# Patient Record
Sex: Male | Born: 1960 | Race: White | Hispanic: No | Marital: Married | Smoking: Current every day smoker
Health system: Southern US, Community
[De-identification: ages and names within clinical notes are randomized; demographics above are authoritative.]

---

## 2000-09-26 ENCOUNTER — Observation Stay (HOSPITAL_COMMUNITY): Admission: RE | Admit: 2000-09-26 | Discharge: 2000-09-27 | Payer: Self-pay | Admitting: Neurosurgery

## 2000-10-29 ENCOUNTER — Encounter: Admission: RE | Admit: 2000-10-29 | Discharge: 2000-10-29 | Payer: Self-pay | Admitting: Neurosurgery

## 2001-01-28 ENCOUNTER — Encounter: Admission: RE | Admit: 2001-01-28 | Discharge: 2001-01-28 | Payer: Self-pay | Admitting: Neurosurgery

## 2005-05-01 ENCOUNTER — Ambulatory Visit: Payer: Self-pay | Admitting: Internal Medicine

## 2007-08-27 ENCOUNTER — Encounter: Payer: Self-pay | Admitting: Internal Medicine

## 2007-08-27 DIAGNOSIS — M25519 Pain in unspecified shoulder: Secondary | ICD-10-CM

## 2007-08-27 DIAGNOSIS — Z87898 Personal history of other specified conditions: Secondary | ICD-10-CM

## 2007-08-27 DIAGNOSIS — S82009A Unspecified fracture of unspecified patella, initial encounter for closed fracture: Secondary | ICD-10-CM

## 2008-01-07 ENCOUNTER — Telehealth (INDEPENDENT_AMBULATORY_CARE_PROVIDER_SITE_OTHER): Payer: Self-pay | Admitting: *Deleted

## 2013-12-09 ENCOUNTER — Other Ambulatory Visit: Payer: Self-pay | Admitting: Family Medicine

## 2013-12-09 DIAGNOSIS — R740 Nonspecific elevation of levels of transaminase and lactic acid dehydrogenase [LDH]: Principal | ICD-10-CM

## 2013-12-09 DIAGNOSIS — R7401 Elevation of levels of liver transaminase levels: Secondary | ICD-10-CM

## 2013-12-22 ENCOUNTER — Ambulatory Visit
Admission: RE | Admit: 2013-12-22 | Discharge: 2013-12-22 | Disposition: A | Discharge status: Home / Self Care | Payer: BC Managed Care – PPO | Source: Ambulatory Visit | Attending: Family Medicine | Admitting: Family Medicine

## 2013-12-22 DIAGNOSIS — R740 Nonspecific elevation of levels of transaminase and lactic acid dehydrogenase [LDH]: Principal | ICD-10-CM

## 2013-12-22 DIAGNOSIS — R7401 Elevation of levels of liver transaminase levels: Secondary | ICD-10-CM

## 2015-08-14 IMAGING — US US ABDOMEN LIMITED
1 series · 14 of 25 positions shown · non-contrast
Comparison: None.

CLINICAL DATA: Elevated liver function tests

EXAM:
US ABDOMEN LIMITED - RIGHT UPPER QUADRANT

[Series 1: us abdomen limited · 0.28mm/px · 14 of 53 slices shown]
[im 1/53]
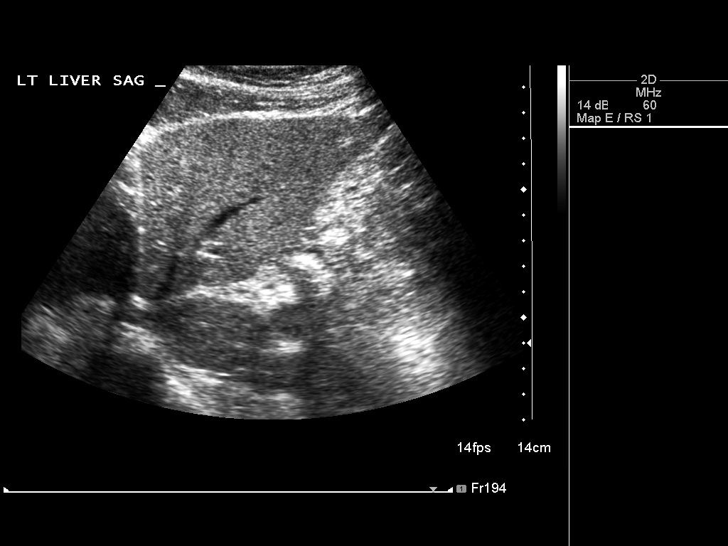
[im 5/53]
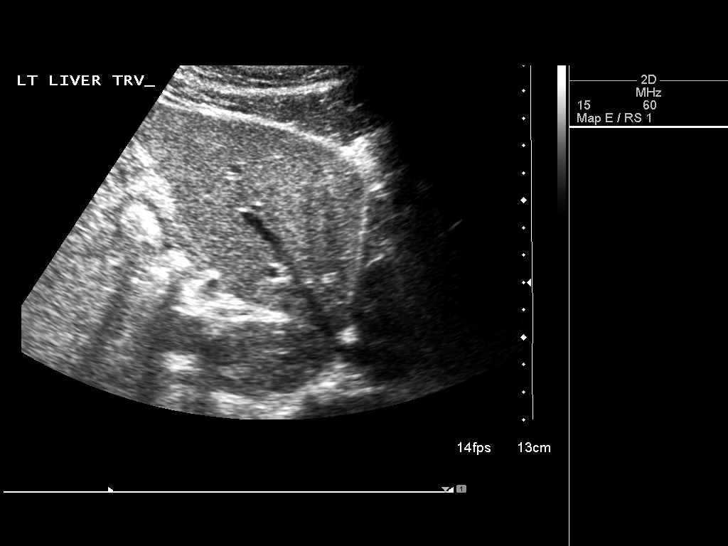
[im 9/53]
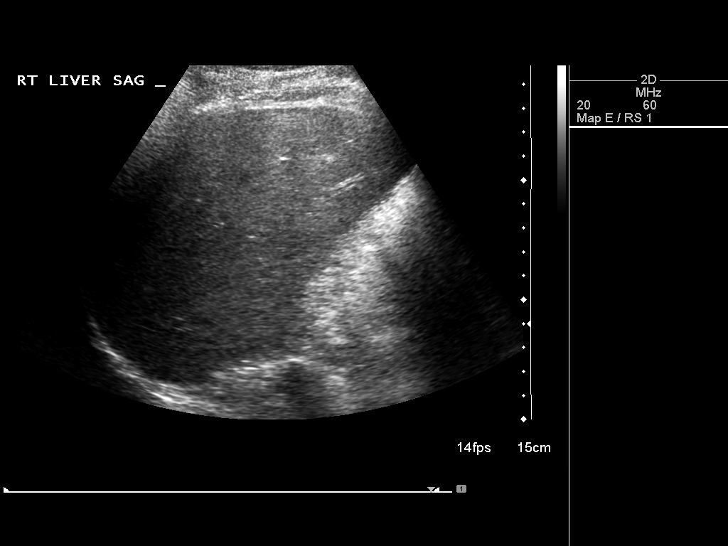
[im 14/53]
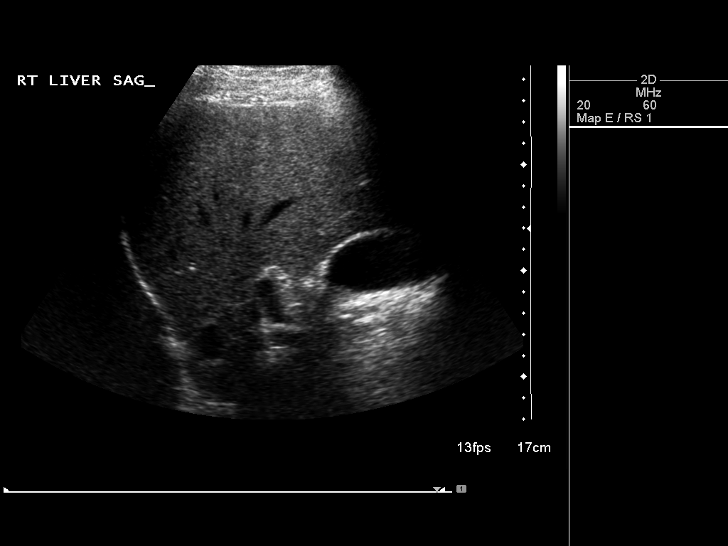
[im 18/53]
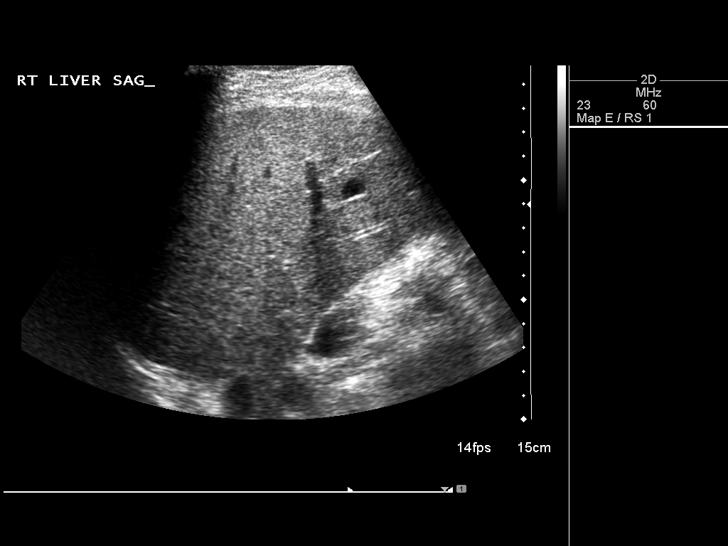
[im 20/53]
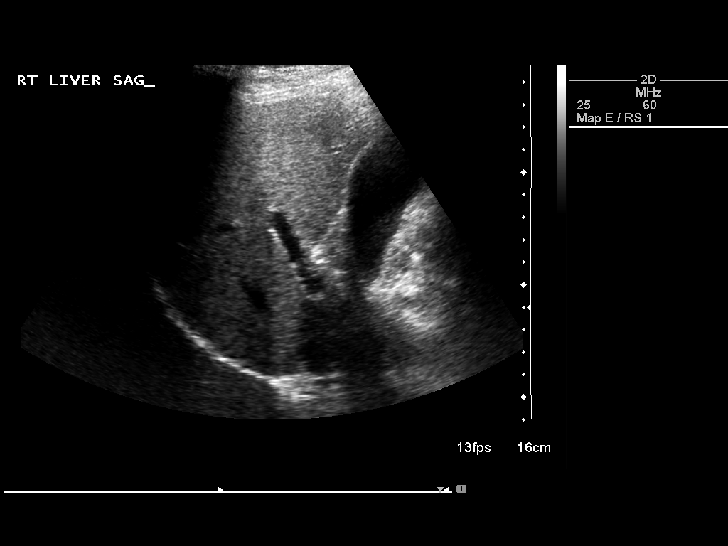
[im 24/53]
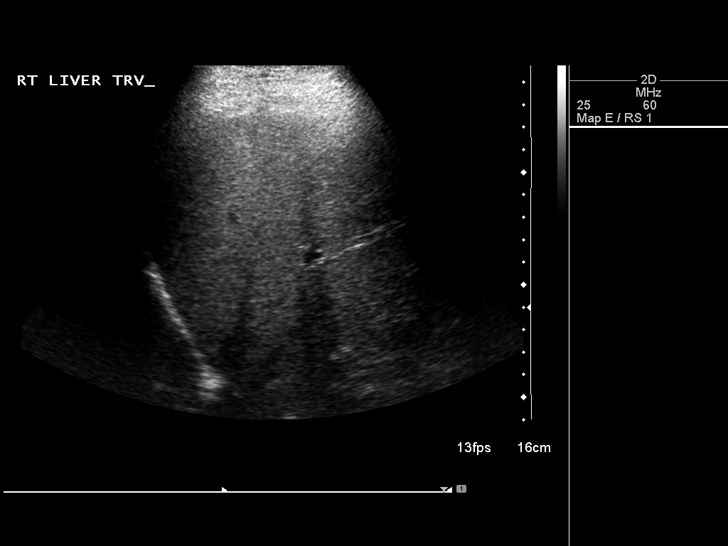
[im 29/53]
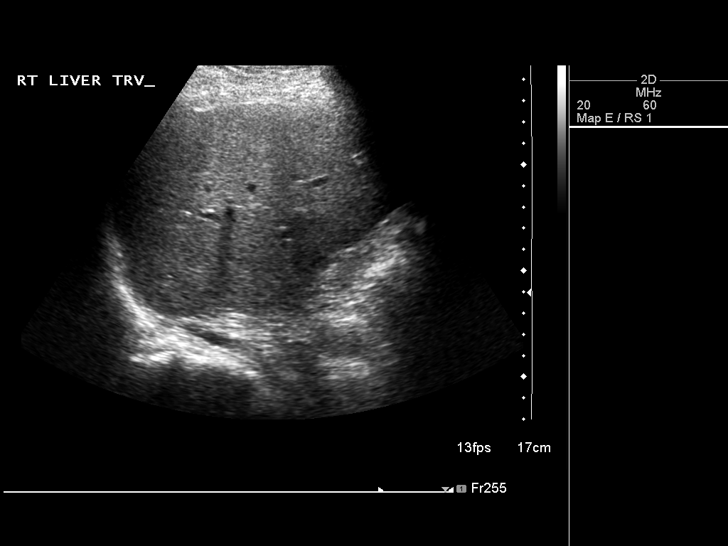
[im 33/53]
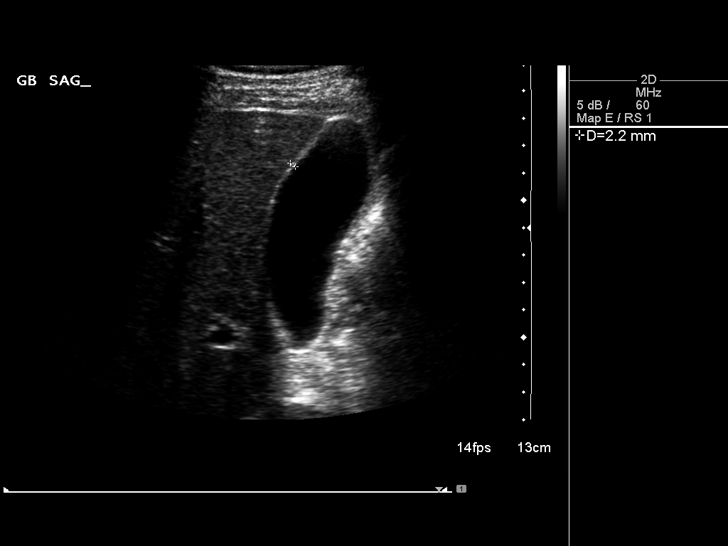
[im 35/53]
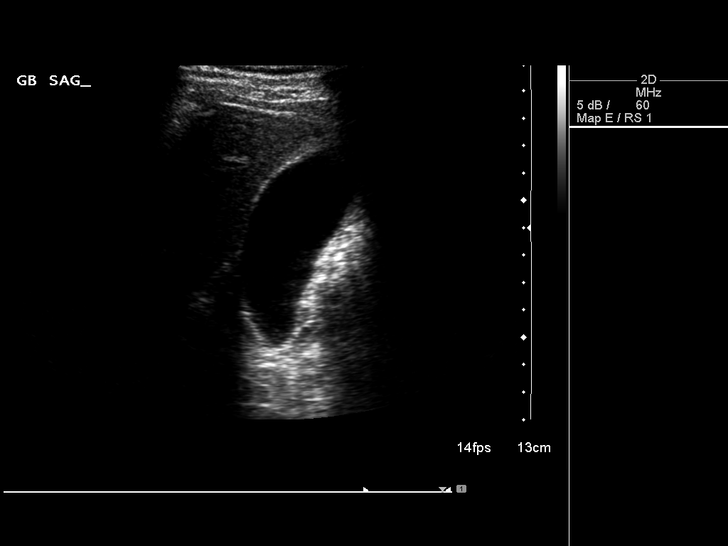
[im 40/53]
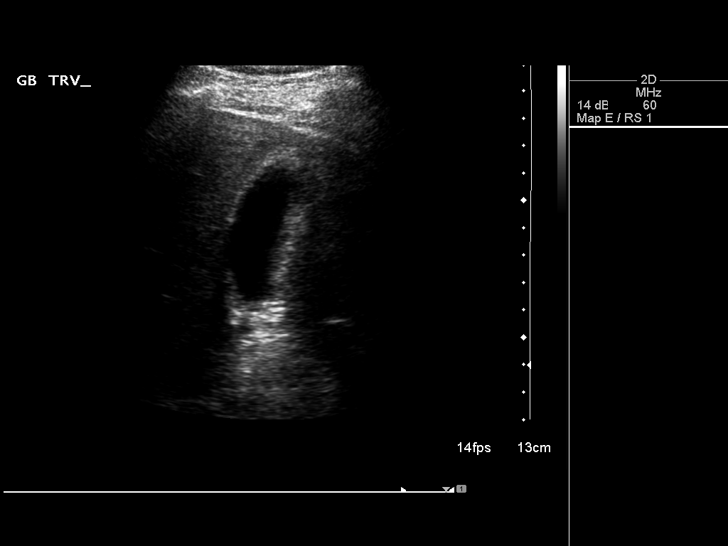
[im 44/53]
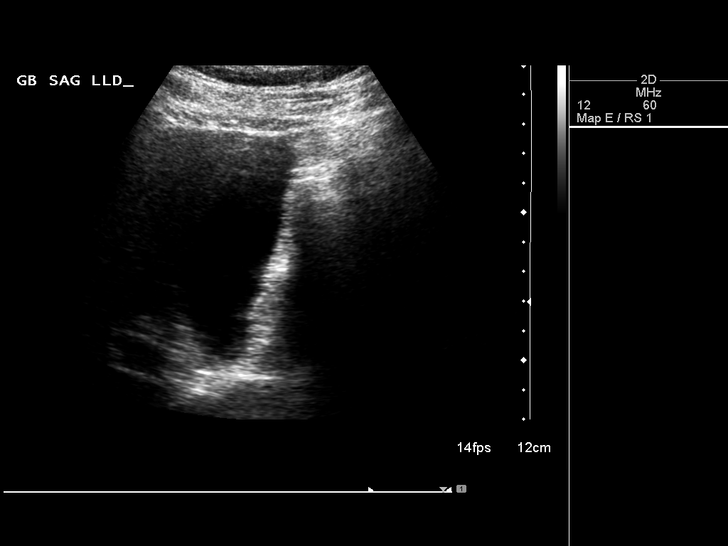
[im 48/53]
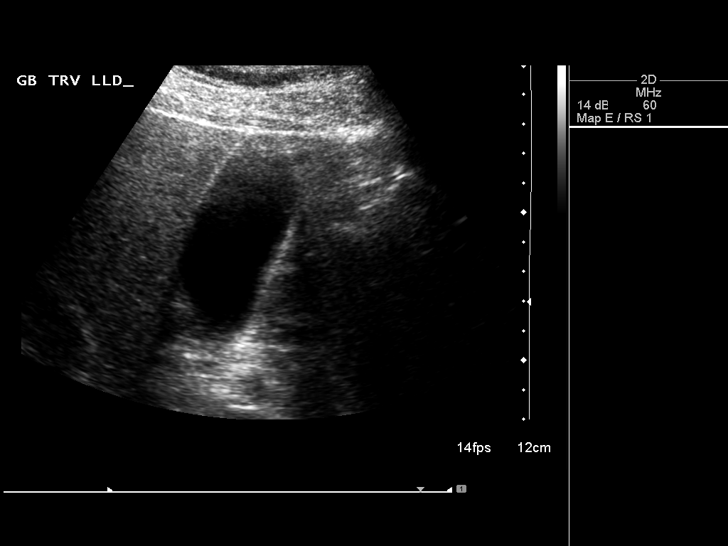
[im 53/53]
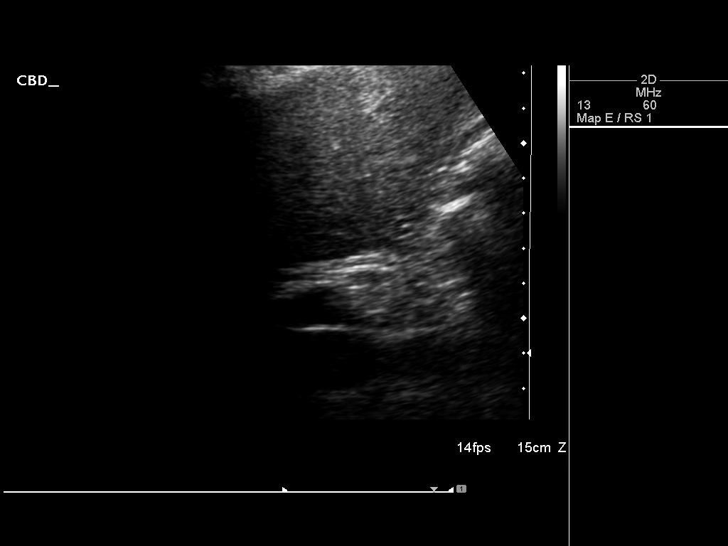

[14 of 25 positions shown; findings below may reference images not displayed]

FINDINGS: Gallbladder:

No gallstones or wall thickening visualized. No sonographic Murphy
sign noted.

Common bile duct:

Diameter: 4.1 mm

Liver:

Normal in size and parenchymal echogenicity. There is an 11 mm right
lobe cyst. No other liver masses or focal lesions. Normal
hepatopetal flow documented in the portal vein.
IMPRESSION: 1. Small, 11 mm, hepatic cyst in the right lobe. No other
abnormalities.

## 2016-06-04 DIAGNOSIS — W57XXXA Bitten or stung by nonvenomous insect and other nonvenomous arthropods, initial encounter: Secondary | ICD-10-CM | POA: Diagnosis not present

## 2016-06-04 DIAGNOSIS — L089 Local infection of the skin and subcutaneous tissue, unspecified: Secondary | ICD-10-CM | POA: Diagnosis not present

## 2016-06-12 DIAGNOSIS — M791 Myalgia: Secondary | ICD-10-CM | POA: Diagnosis not present

## 2016-06-12 DIAGNOSIS — R740 Nonspecific elevation of levels of transaminase and lactic acid dehydrogenase [LDH]: Secondary | ICD-10-CM | POA: Diagnosis not present

## 2016-06-12 DIAGNOSIS — Z0001 Encounter for general adult medical examination with abnormal findings: Secondary | ICD-10-CM | POA: Diagnosis not present

## 2016-06-12 DIAGNOSIS — E785 Hyperlipidemia, unspecified: Secondary | ICD-10-CM | POA: Diagnosis not present

## 2016-06-12 DIAGNOSIS — K4090 Unilateral inguinal hernia, without obstruction or gangrene, not specified as recurrent: Secondary | ICD-10-CM | POA: Diagnosis not present

## 2016-06-25 DIAGNOSIS — K4090 Unilateral inguinal hernia, without obstruction or gangrene, not specified as recurrent: Secondary | ICD-10-CM | POA: Diagnosis not present

## 2016-09-25 DIAGNOSIS — K4091 Unilateral inguinal hernia, without obstruction or gangrene, recurrent: Secondary | ICD-10-CM | POA: Diagnosis not present

## 2016-11-27 ENCOUNTER — Ambulatory Visit: Payer: 59 | Attending: Family Medicine | Admitting: Physical Therapy

## 2016-11-27 DIAGNOSIS — M25512 Pain in left shoulder: Secondary | ICD-10-CM | POA: Diagnosis present

## 2016-11-27 DIAGNOSIS — M542 Cervicalgia: Secondary | ICD-10-CM

## 2016-11-27 DIAGNOSIS — R293 Abnormal posture: Secondary | ICD-10-CM | POA: Diagnosis not present

## 2016-11-27 DIAGNOSIS — R208 Other disturbances of skin sensation: Secondary | ICD-10-CM | POA: Diagnosis present

## 2016-11-27 DIAGNOSIS — M6281 Muscle weakness (generalized): Secondary | ICD-10-CM | POA: Insufficient documentation

## 2016-11-27 DIAGNOSIS — G8929 Other chronic pain: Secondary | ICD-10-CM | POA: Diagnosis present

## 2016-11-27 NOTE — Therapy (Signed)
[facility] Outpatient Rehabilitation [facility] [address] [place], [place], [address] Phone: [phone]   Fax:  [phone]  Physical Therapy Evaluation  Patient Details  Name: Anthony Shields MRN: [id number] Date of Birth: 21-Sep-1960 Referring Provider: Dr. Susa Simmonds   Encounter Date: 11/27/2016      PT End of Session - 11/27/16 2136    Visit Number 1   Number of Visits 16   Date for PT Re-Evaluation 01/22/17   PT Start Time 0846   PT Stop Time 0930   PT Time Calculation (min) 44 min   Activity Tolerance Patient tolerated treatment well   Behavior During Therapy [facility] for tasks assessed/performed      No past medical history on file.  No past surgical history on file.  There were no vitals filed for this visit.       Subjective Assessment - 11/27/16 0854    Subjective Pt has had chronic intermittent pain in L shoulder for 2-3 yrs. He has chronic weakness in L biceps.  He has difficulty lifting, turning head and driving long periods.  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  Pain is in upper back, neck and topn of L shoulder.  He has tried to change pillows, thinks about his posture and feels it may be related to a chronic neck issue.    Pertinent History cervical fusion 15 yrs ago, inguinal hernia repair    Limitations Sitting;Lifting   How long can you sit comfortably? gets uncomfortable 30 min, changes hand position    Diagnostic tests none recent    Patient Stated Goals Pt would like to be able to lift, use arms and have less pain.    Currently in Pain? Yes   Pain Score 4    Pain Location Shoulder  neck    Pain Orientation Left   Pain Descriptors / Indicators Aching;Tightness;Burning   Pain Type Chronic pain   Pain Radiating Towards neck and superiod aspect shoulder, rarely in scapular area   Pain Onset More than a month ago   Pain Frequency Constant   Aggravating Factors  AM (sleep), driving, hold arms up    Pain Relieving Factors Ibuprofen, rest,  learned to deal with it.    Effect of Pain on Daily Activities limits comfort at work             [facility] PT Assessment - 11/27/16 0001      Assessment   Medical Diagnosis Lt rhomboid muscle pain, myalgia    Referring Provider Dr. Susa Simmonds    Onset Date/Surgical Date --  chronic    Hand Dominance Right   Next MD Visit none yet    Prior Therapy No      Precautions   Precautions None     Restrictions   Weight Bearing Restrictions No     Balance Screen   Has the patient fallen in the past 6 months No     Home Environment   Living Environment Private residence   Living Arrangements Spouse/significant other   Type of Home House     Prior Function   Level of Independence Independent   Vocation Full time employment   Leisure tinkering in garage and playing with grandkids      Cognition   Overall Cognitive Status Within Functional Limits for tasks assessed     Observation/Other Assessments   Observations L biceps atrophy, observed sacral sitting in lobby   Focus on Therapeutic Outcomes (FOTO)  nt  Sensation   Light Touch Impaired by gross assessment   Additional Comments slight tingling in L UE , legs go numb sometimes sitting too long      Posture/Postural Control   Posture/Postural Control Postural limitations   Postural Limitations Rounded Shoulders;Forward head;Posterior pelvic tilt   Posture Comments sway back, shoulders IR bilat.       AROM   Cervical Flexion 50   Cervical Extension 40  pain on L    Cervical - Right Side Bend 35   Cervical - Left Side Bend 35   Cervical - Right Rotation ERP , 15% limited    Cervical - Left Rotation ERP, 20% limited      Strength   Right Shoulder Flexion 5/5   Right Shoulder ABduction 5/5   Left Shoulder Flexion 4+/5   Left Shoulder ABduction 4+/5   Right Elbow Flexion 5/5   Right Elbow Extension 5/5   Left Elbow Flexion 4+/5   Left Elbow Extension 4+/5     Palpation   Palpation comment pain throughout upper and  middle cervical paraspinals, especially L side, upper traps knotty and referred pain into L prox UE.      Spurling's   Findings Negative   Side Left            Objective measurements completed on examination: See above findings.          [facility] Adult PT Treatment/Exercise - 11/27/16 0001      Neck Exercises: Standing   Neck Retraction 10 reps     Moist Heat Therapy   Number Minutes Moist Heat 10 Minutes   Moist Heat Location Cervical     Neck Exercises: Stretches   Upper Trapezius Stretch 2 reps;30 seconds   Levator Stretch 2 reps;30 seconds   Corner Stretch 3 reps                PT Education - 11/27/16 2136    Education provided Yes   Education Details posture, POC, PT, HEP, differential diagnosis    Person(s) Educated Patient   Methods Explanation;Demonstration;Tactile cues;Verbal cues;Handout   Comprehension Verbalized understanding;Need further instruction          PT Short Term Goals - 11/27/16 2149      PT SHORT TERM GOAL #1   Title Pt will be I with initial HEP   Time 4   Period Weeks   Status New   Target Date 12/25/16     PT SHORT TERM GOAL #2   Title Pt will be able to report taking stretch breaks frequently anf integrating postural awareness into travel for work.    Time 4   Period Weeks   Status New   Target Date 12/25/16     PT SHORT TERM GOAL #3   Title Pt will report 25% less sensory symptoms in L UE with driving, sleeping.     Time 4   Period Weeks   Status New   Target Date 12/25/16           PT Long Term Goals - 11/27/16 2153      PT LONG TERM GOAL #1   Title Pt will be able to turn head to drive without increasing pain for safety and comfort while driving.     Time 8   Period Weeks   Status New   Target Date 01/22/17     PT LONG TERM GOAL #2   Title Pt will be able to reach, lift and use arms  for work without increasing neck pain.    Time 8   Period Weeks   Status New   Target Date 01/22/17     PT  LONG TERM GOAL #3   Title Pt will be I with HEP for flexibility, posture and strength.    Time 8   Period Weeks   Status New   Target Date 01/22/17     PT LONG TERM GOAL #4   Title Pt will be I with concepts of lifting, sitting posture and make changes to seating to improve alignment   Time 8   Period Weeks   Status New   Target Date 01/22/17                Plan - 11/27/16 2137    Clinical Impression Statement Pt with low complexity of stable neck and upper back pain and sensory disturbance for 2-3 yrs intermittently. Exercises he was previously given have helped him to a degree.  Rhomboid muscle was minimally tender today.  Rotation and sidebending to L recreate pain in L prox shoulder.  Given his L elbow flex and ext weakness I suspect his cervical spine is involved.  His job sets him up for postural changes and weakness, he has modified his environment but could benefit from PT to further improve his ROM and strength, comfort with mobility.     Clinical Presentation Stable   Clinical Decision Making Low   Rehab Potential Good   PT Frequency Other (comment)  may only be able to do 1 x per week due to work schedule    PT Duration 8 weeks   PT Treatment/Interventions ADLs/Self Care Home Management;Neuromuscular re-education;Dry needling;Taping;Passive range of motion;Electrical Stimulation;Cryotherapy;Ultrasound;Moist Heat;Therapeutic activities;Therapeutic exercise;Patient/family education;Manual techniques   PT Next Visit Plan check HEP, manual, MHP and posture ed    PT Home Exercise Plan neck ROM, chin tuck, scap retract    Consulted and Agree with Plan of Care Patient      Patient will benefit from skilled therapeutic intervention in order to improve the following deficits and impairments:  Impaired flexibility, Impaired UE functional use, Hypomobility, Decreased strength, Decreased range of motion, Postural dysfunction, Increased fascial restricitons, Pain, Decreased  mobility  Visit Diagnosis: Other disturbances of skin sensation  Muscle weakness (generalized)  Abnormal posture  Cervicalgia  Chronic left shoulder pain     Problem List Patient Active Problem List   Diagnosis Date Noted  . SHOULDER PAIN, LEFT 08/27/2007  . FRACTURE, PATELLA 08/27/2007  . INGUINAL HERNIA, HX OF 08/27/2007    Sampson Self 11/27/2016, 10:08 PM  [facility] [address] [place], [place], [address] Phone: [phone]   Fax:  [phone]  Name: Eston EstersBarry K Vokes MRN: [id number] Date of Birth: Sep 11, 1960   Karie MainlandJennifer Britiany Silbernagel, PT 11/27/16 10:08 PM Phone: [phone] Fax: [phone]

## 2016-12-03 ENCOUNTER — Ambulatory Visit: Payer: 59 | Admitting: Physical Therapy

## 2016-12-03 DIAGNOSIS — M542 Cervicalgia: Secondary | ICD-10-CM

## 2016-12-03 DIAGNOSIS — R293 Abnormal posture: Secondary | ICD-10-CM

## 2016-12-03 DIAGNOSIS — R208 Other disturbances of skin sensation: Secondary | ICD-10-CM | POA: Diagnosis not present

## 2016-12-03 DIAGNOSIS — G8929 Other chronic pain: Secondary | ICD-10-CM

## 2016-12-03 DIAGNOSIS — M25512 Pain in left shoulder: Secondary | ICD-10-CM

## 2016-12-03 DIAGNOSIS — M6281 Muscle weakness (generalized): Secondary | ICD-10-CM

## 2016-12-03 NOTE — Therapy (Signed)
[facility] Outpatient Rehabilitation [facility] [address] [place], [place], [address] Phone: [phone]   Fax:  [phone]  Physical Therapy Treatment  Patient Details  Name: Anthony Shields MRN: [id number] Date of Birth: 03-18-1961 Referring Provider: Dr. Susa Simmonds   Encounter Date: 12/03/2016      PT End of Session - 12/03/16 0914    Visit Number 2   Number of Visits 16   Date for PT Re-Evaluation 01/22/17   PT Start Time 0848   PT Stop Time 0936   PT Time Calculation (min) 48 min   Activity Tolerance Patient tolerated treatment well   Behavior During Therapy [facility] for tasks assessed/performed      No past medical history on file.  No past surgical history on file.  There were no vitals filed for this visit.      Subjective Assessment - 12/03/16 0850    Subjective I've been hurting all week.  My exercises make it worse.  Lavenia Atlas been doing my exercises but lying down make my L UE arm numb.     Currently in Pain? Yes   Pain Score 4    Pain Location Shoulder  neck    Pain Orientation Left   Pain Descriptors / Indicators Aching;Tightness   Pain Type Chronic pain   Pain Onset More than a month ago   Pain Frequency Intermittent   Aggravating Factors  stretches make it worse maybe i;m pulling too hard   Pain Relieving Factors ibuprofen, rest              [facility] Adult PT Treatment/Exercise - 12/03/16 0001      Neck Exercises: Supine   Neck Retraction 10 reps;5 secs   Capital Flexion 10 reps   Capital Flexion Limitations and ext on yellow ball    Cervical Rotation Both;10 reps     Moist Heat Therapy   Number Minutes Moist Heat 15 Minutes   Moist Heat Location Cervical     Electrical Stimulation   Electrical Stimulation Location posterior cervicals   Electrical Stimulation Action IFC   Electrical Stimulation Parameters to tol    Electrical Stimulation Goals Pain     Manual Therapy   Manual Therapy Joint mobilization;Soft tissue  mobilization;Myofascial release;Passive ROM;Manual Traction   Soft tissue mobilization suboccipitals    Myofascial Release cervicals   Passive ROM rotation and sidebending to R      Neck Exercises: Stretches   Upper Trapezius Stretch 2 reps;30 seconds   Levator Stretch 2 reps;30 seconds   Corner Stretch 3 reps                PT Education - 12/03/16 0913    Education provided Yes   Education Details dont pull on head    Person(s) Educated Patient   Methods Explanation;Demonstration   Comprehension Verbalized understanding;Returned demonstration          PT Short Term Goals - 12/03/16 0930      PT SHORT TERM GOAL #1   Title Pt will be I with initial HEP   Status On-going     PT SHORT TERM GOAL #2   Title Pt will be able to report taking stretch breaks frequently anf integrating postural awareness into travel for work.    Status On-going     PT SHORT TERM GOAL #3   Title Pt will report 25% less sensory symptoms in L UE with driving, sleeping.     Status On-going  PT Long Term Goals - 11/27/16 2153      PT LONG TERM GOAL #1   Title Pt will be able to turn head to drive without increasing pain for safety and comfort while driving.     Time 8   Period Weeks   Status New   Target Date 01/22/17     PT LONG TERM GOAL #2   Title Pt will be able to reach, lift and use arms for work without increasing neck pain.    Time 8   Period Weeks   Status New   Target Date 01/22/17     PT LONG TERM GOAL #3   Title Pt will be I with HEP for flexibility, posture and strength.    Time 8   Period Weeks   Status New   Target Date 01/22/17     PT LONG TERM GOAL #4   Title Pt will be I with concepts of lifting, sitting posture and make changes to seating to improve alignment   Time 8   Period Weeks   Status New   Target Date 01/22/17               Plan - 12/03/16 3536    Clinical Impression Statement Patient with increased pain this week, asked  him to modify HEP by avoiding excessive pull on head.  Manual work effective in softening posterior cervicals.  Able to do corner stretch without increased numbness in L UE.  Trial of IFC and MHP to reduce soreness and pain.    PT Next Visit Plan check HEP, manual, MHP and posture ed    PT Home Exercise Plan neck ROM, chin tuck, scap retract    Consulted and Agree with Plan of Care Patient      Patient will benefit from skilled therapeutic intervention in order to improve the following deficits and impairments:  Impaired flexibility, Impaired UE functional use, Hypomobility, Decreased strength, Decreased range of motion, Postural dysfunction, Increased fascial restricitons, Pain, Decreased mobility  Visit Diagnosis: Other disturbances of skin sensation  Muscle weakness (generalized)  Abnormal posture  Chronic left shoulder pain  Cervicalgia     Problem List Patient Active Problem List   Diagnosis Date Noted  . SHOULDER PAIN, LEFT 08/27/2007  . FRACTURE, PATELLA 08/27/2007  . INGUINAL HERNIA, HX OF 08/27/2007    Beaumont Austad 12/03/2016, 9:32 AM  [facility] [address] [place], [place], [address] Phone: [phone]   Fax:  [phone]  Name: DONTREY SIKORSKI MRN: [id number] Date of Birth: 06/24/1960  Karie Mainland, PT 12/03/16 9:32 AM Phone: [phone] Fax: [phone]

## 2016-12-07 ENCOUNTER — Encounter: Payer: Self-pay | Admitting: Physical Therapy

## 2016-12-07 ENCOUNTER — Ambulatory Visit: Payer: 59 | Admitting: Physical Therapy

## 2016-12-07 DIAGNOSIS — G8929 Other chronic pain: Secondary | ICD-10-CM

## 2016-12-07 DIAGNOSIS — M542 Cervicalgia: Secondary | ICD-10-CM

## 2016-12-07 DIAGNOSIS — R208 Other disturbances of skin sensation: Principal | ICD-10-CM

## 2016-12-07 DIAGNOSIS — M6281 Muscle weakness (generalized): Secondary | ICD-10-CM

## 2016-12-07 DIAGNOSIS — M25512 Pain in left shoulder: Secondary | ICD-10-CM

## 2016-12-07 DIAGNOSIS — R293 Abnormal posture: Secondary | ICD-10-CM

## 2016-12-07 NOTE — Therapy (Addendum)
[facility] Outpatient Rehabilitation [facility] [address] [place], [place], [address] Phone: [phone]   Fax:  [phone]  Physical Therapy Treatment  Patient Details  Name: ANSEN OCASIO MRN: [id number] Date of Birth: [birth year] Referring Provider: Dr. Susa Simmonds   Encounter Date: 12/07/2016      PT End of Session - 12/07/16 1019    Visit Number 3   Number of Visits 16   Date for PT Re-Evaluation 01/22/17   PT Start Time 1018   PT Stop Time 1112   PT Time Calculation (min) 54 min   Activity Tolerance Patient tolerated treatment well   Behavior During Therapy [facility] for tasks assessed/performed      History reviewed. No pertinent past medical history.  History reviewed. No pertinent surgical history.  There were no vitals filed for this visit.      Subjective Assessment - 12/07/16 1020    Subjective Feeling better with static sitting but hurts with movements- sidebend bilat hurt on L.    Patient Stated Goals Pt would like to be able to lift, use arms and have less pain.    Currently in Pain? Yes   Pain Score 3   only when sidbending   Pain Location Shoulder   Pain Orientation Left;Upper   Pain Descriptors / Indicators Tightness                         [facility] Adult PT Treatment/Exercise - 12/07/16 0001      Exercises   Exercises Shoulder     Shoulder Exercises: Standing   External Rotation Limitations bilat UE blue tband   Extension 20 reps   Theraband Level (Shoulder Extension) Level 3 (Green)   Row 20 reps   Theraband Level (Shoulder Row) Level 4 (Blue)   Other Standing Exercises horiz abd + ext behind back     Modalities   Modalities Moist Heat     Moist Heat Therapy   Number Minutes Moist Heat 15 Minutes   Moist Heat Location Cervical     Manual Therapy   Manual Therapy Joint mobilization   Manual therapy comments skilled monitoring & palpation while DN   Joint Mobilization first rib depression, gross rib ER   Soft tissue mobilization L upper trap          Trigger Point Dry Needling - 12/07/16 1058    Consent Given? Yes   Education Handout Provided --  verbal education   Muscles Treated Upper Body Upper trapezius   Upper Trapezius Response Twitch reponse elicited;Palpable increased muscle length              PT Education - 12/07/16 1107    Education provided Yes   Education Details exercise form/rationale, TPDN & expected outcomes, importance of posture in daily activities   Person(s) Educated Patient   Methods Explanation;Demonstration;Tactile cues;Verbal cues;Handout   Comprehension Verbalized understanding;Returned demonstration;Verbal cues required;Tactile cues required;Need further instruction          PT Short Term Goals - 12/03/16 0930      PT SHORT TERM GOAL #1   Title Pt will be I with initial HEP   Status On-going     PT SHORT TERM GOAL #2   Title Pt will be able to report taking stretch breaks frequently anf integrating postural awareness into travel for work.    Status On-going     PT SHORT TERM GOAL #3   Title Pt will report 25% less sensory symptoms  in L UE with driving, sleeping.     Status On-going           PT Long Term Goals - 11/27/16 2153      PT LONG TERM GOAL #1   Title Pt will be able to turn head to drive without increasing pain for safety and comfort while driving.     Time 8   Period Weeks   Status New   Target Date 01/22/17     PT LONG TERM GOAL #2   Title Pt will be able to reach, lift and use arms for work without increasing neck pain.    Time 8   Period Weeks   Status New   Target Date 01/22/17     PT LONG TERM GOAL #3   Title Pt will be I with HEP for flexibility, posture and strength.    Time 8   Period Weeks   Status New   Target Date 01/22/17     PT LONG TERM GOAL #4   Title Pt will be I with concepts of lifting, sitting posture and make changes to seating to improve alignment   Time 8   Period Weeks   Status  New   Target Date 01/22/17               Plan - 12/07/16 1104    Clinical Impression Statement Reported "still hurts but feels better" after TPDN to L upper trap. Was educated on soreness that will follow. Pt was able to complete exercises and perform appropriate scapular control without numbness in arm.    PT Treatment/Interventions ADLs/Self Care Home Management;Neuromuscular re-education;Dry needling;Taping;Passive range of motion;Electrical Stimulation;Cryotherapy;Ultrasound;Moist Heat;Therapeutic activities;Therapeutic exercise;Patient/family education;Manual techniques   PT Next Visit Plan check HEP, manual, MHP and posture ed    PT Home Exercise Plan neck ROM, chin tuck, scap retract, bilat UE ER, resisted ext, row   Consulted and Agree with Plan of Care Patient      Patient will benefit from skilled therapeutic intervention in order to improve the following deficits and impairments:  Impaired flexibility, Impaired UE functional use, Hypomobility, Decreased strength, Decreased range of motion, Postural dysfunction, Increased fascial restricitons, Pain, Decreased mobility  Visit Diagnosis: Other disturbances of skin sensation  Muscle weakness (generalized)  Abnormal posture  Chronic left shoulder pain  Cervicalgia     Problem List Patient Active Problem List   Diagnosis Date Noted  . SHOULDER PAIN, LEFT 08/27/2007  . FRACTURE, PATELLA 08/27/2007  . INGUINAL HERNIA, HX OF 08/27/2007    Benoit Meech C. Jiovany Scheffel PT, DPT 12/07/16 11:11 AM   [facility] Outpatient Rehabilitation [facility] [address] [place], [place], [address] Phone: [phone]   Fax:  [phone]  Name: ERVINE WITUCKI MRN: [id number] Date of Birth: 12/04/1960

## 2016-12-10 ENCOUNTER — Encounter: Payer: Self-pay | Admitting: Physical Therapy

## 2016-12-10 ENCOUNTER — Ambulatory Visit: Payer: 59 | Admitting: Physical Therapy

## 2016-12-10 DIAGNOSIS — R208 Other disturbances of skin sensation: Secondary | ICD-10-CM | POA: Diagnosis not present

## 2016-12-10 DIAGNOSIS — M6281 Muscle weakness (generalized): Secondary | ICD-10-CM

## 2016-12-10 DIAGNOSIS — M542 Cervicalgia: Secondary | ICD-10-CM

## 2016-12-10 DIAGNOSIS — R293 Abnormal posture: Secondary | ICD-10-CM

## 2016-12-10 DIAGNOSIS — G8929 Other chronic pain: Secondary | ICD-10-CM

## 2016-12-10 DIAGNOSIS — M25512 Pain in left shoulder: Secondary | ICD-10-CM

## 2016-12-10 NOTE — Therapy (Signed)
[place], [place], [address] Phone: [phone]   Fax:  [phone]  Physical Therapy Treatment  Patient Details  Name: Anthony Shields MRN: [id number] Date of Birth: 05-Jul-1960 Referring Provider: Dr. Lynelle Doctor   Encounter Date: 12/10/2016      PT End of Session - 12/10/16 0901    Visit Number 4   Number of Visits 16   Date for PT Re-Evaluation 01/22/17   PT Start Time 6222   PT Stop Time 0942   PT Time Calculation (min) 55 min   Activity Tolerance Patient tolerated treatment well   Behavior During Therapy [facility] for tasks assessed/performed      History reviewed. No pertinent past medical history.  History reviewed. No pertinent surgical history.  There were no vitals filed for this visit.      Subjective Assessment - 12/10/16 0848    Subjective It hurt but it worked! (Dry needling)   Currently in Pain? No/denies            [facility] PT Assessment - 12/10/16 0001      AROM   Cervical Flexion 57   Cervical Extension 45   Cervical - Right Side Bend 45   Cervical - Left Side Bend 47   Cervical - Right Rotation 10%  tight   Cervical - Left Rotation 20%  tight           [facility] Adult PT Treatment/Exercise - 12/10/16 0001      Shoulder Exercises: Standing   Horizontal ABduction Strengthening;Both;20 reps   Theraband Level (Shoulder Horizontal ABduction) Level 3 (Green)   External Rotation Strengthening;Both;20 reps   Theraband Level (Shoulder External Rotation) Level 4 (Blue)   Extension 20 reps   Theraband Level (Shoulder Extension) Level 3 (Green)   Row 20 reps   Theraband Level (Shoulder Row) Level 4 (Blue)     Shoulder Exercises: ROM/Strengthening   UBE (Upper Arm Bike) 5 min in reverse, level 1 cues for posture      Shoulder Exercises: Stretch   Corner Stretch 3 reps     Moist Heat Therapy   Number Minutes Moist Heat 10 Minutes   Moist Heat Location Cervical     Manual  Therapy   Soft tissue mobilization bilateral posterior cervicals, L upper trap and L levator scap   Myofascial Release cervicals   Passive ROM rotation and sidebending to R      Neck Exercises: Stretches   Upper Trapezius Stretch 2 reps;30 seconds   Levator Stretch 2 reps;30 seconds   Corner Stretch 3 reps                  PT Short Term Goals - 12/10/16 0901      PT SHORT TERM GOAL #1   Title Pt will be I with initial HEP   Status Achieved     PT SHORT TERM GOAL #2   Title Pt will be able to report taking stretch breaks frequently and integrating postural awareness into travel for work.    Status Achieved     PT SHORT TERM GOAL #3   Title Pt will report 25% less sensory symptoms in L UE with driving, sleeping.     Status Achieved           PT Long Term Goals - 12/10/16 0913      PT LONG TERM GOAL #1   Title Pt will be able to turn head to drive without increasing pain  for safety and comfort while driving.     Baseline improved, tight end range    Status Partially Met     PT LONG TERM GOAL #2   Title Pt will be able to reach, lift and use arms for work without increasing neck pain.    Baseline pain end of the day    Status On-going     PT LONG TERM GOAL #3   Title Pt will be I with HEP for flexibility, posture and strength.    Status On-going     PT LONG TERM GOAL #4   Title Pt will be I with concepts of lifting, sitting posture and make changes to seating to improve alignment   Status Unable to assess               Plan - 12/10/16 0905    Clinical Impression Statement I with HEP, has backed down on cervical stretching and that seems to have helped.  DId perform stretches today without increase in pain. Numbness in L UE 25-50% less than prior.  AROM improved as well.    PT Next Visit Plan continue with postural stretching, manual, MHP and posture ed    PT Home Exercise Plan neck ROM, chin tuck, scap retract, bilat UE ER, resisted ext, row    Consulted and Agree with Plan of Care Patient      Patient will benefit from skilled therapeutic intervention in order to improve the following deficits and impairments:  Impaired flexibility, Impaired UE functional use, Hypomobility, Decreased strength, Decreased range of motion, Postural dysfunction, Increased fascial restricitons, Pain, Decreased mobility  Visit Diagnosis: Muscle weakness (generalized)  Abnormal posture  Other disturbances of skin sensation  Chronic left shoulder pain  Cervicalgia     Problem List Patient Active Problem List   Diagnosis Date Noted  . SHOULDER PAIN, LEFT 08/27/2007  . FRACTURE, PATELLA 08/27/2007  . INGUINAL HERNIA, HX OF 08/27/2007    Anthony Shields 12/10/2016, 11:08 AM  [facility] [address] [place], [place], [address] Phone: [phone]   Fax:  [phone]  Name: Anthony Shields MRN: [id number] Date of Birth: 1960/08/03   Raeford Razor, PT 12/10/16 11:08 AM Phone: [phone] Fax: [phone]

## 2016-12-18 ENCOUNTER — Encounter: Payer: Self-pay | Admitting: Physical Therapy

## 2016-12-18 ENCOUNTER — Ambulatory Visit: Payer: 59 | Attending: Family Medicine | Admitting: Physical Therapy

## 2016-12-18 DIAGNOSIS — M25512 Pain in left shoulder: Secondary | ICD-10-CM | POA: Diagnosis present

## 2016-12-18 DIAGNOSIS — R293 Abnormal posture: Secondary | ICD-10-CM | POA: Insufficient documentation

## 2016-12-18 DIAGNOSIS — M6281 Muscle weakness (generalized): Secondary | ICD-10-CM

## 2016-12-18 DIAGNOSIS — G8929 Other chronic pain: Secondary | ICD-10-CM | POA: Insufficient documentation

## 2016-12-18 DIAGNOSIS — M542 Cervicalgia: Secondary | ICD-10-CM | POA: Insufficient documentation

## 2016-12-18 DIAGNOSIS — R208 Other disturbances of skin sensation: Secondary | ICD-10-CM | POA: Diagnosis not present

## 2016-12-18 NOTE — Therapy (Signed)
[place], [place], [address] Phone: [phone]   Fax:  [phone]  Physical Therapy Treatment  Patient Details  Name: Anthony Shields MRN: [id number] Date of Birth: 1960/08/17 Referring Provider: Dr. Lynelle Doctor   Encounter Date: 12/18/2016      PT End of Session - 12/18/16 1549    Visit Number 5   Number of Visits 16   Date for PT Re-Evaluation 01/22/17   PT Start Time 8242   PT Stop Time 1628   PT Time Calculation (min) 39 min   Activity Tolerance Patient tolerated treatment well   Behavior During Therapy [facility] for tasks assessed/performed      History reviewed. No pertinent past medical history.  History reviewed. No pertinent surgical history.  There were no vitals filed for this visit.      Subjective Assessment - 12/18/16 1549    Subjective Burning between shoulder blades. Noted saturday + sunday as well as it got later in the day, lasted abt 30 min. Denies any burning into arms   Patient Stated Goals Pt would like to be able to lift, use arms and have less pain.    Currently in Pain? Yes   Pain Score 3    Pain Location Back   Pain Orientation Upper   Pain Descriptors / Indicators Burning                         [facility] Adult PT Treatment/Exercise - 12/18/16 0001      Neck Exercises: Supine   Neck Retraction 10 reps;5 secs     Shoulder Exercises: Supine   Theraband Level (Shoulder Horizontal ABduction) Level 4 (Blue)   Horizontal ABduction Limitations + chin tucks     Shoulder Exercises: Standing   Row 15 reps   Theraband Level (Shoulder Row) Level 4 (Blue)   Other Standing Exercises extension blue tband     Shoulder Exercises: ROM/Strengthening   UBE (Upper Arm Bike) 2 min L2 retro  ended due to reported numbness out to shoulder     Manual Therapy   Manual therapy comments manual cervical traction   Joint Mobilization prone rib ER   Soft tissue mobilization prone  periscapular on L; supine L cervical paraspinals   Myofascial Release suboccipital release                  PT Short Term Goals - 12/10/16 0901      PT SHORT TERM GOAL #1   Title Pt will be I with initial HEP   Status Achieved     PT SHORT TERM GOAL #2   Title Pt will be able to report taking stretch breaks frequently and integrating postural awareness into travel for work.    Status Achieved     PT SHORT TERM GOAL #3   Title Pt will report 25% less sensory symptoms in L UE with driving, sleeping.     Status Achieved           PT Long Term Goals - 12/10/16 0913      PT LONG TERM GOAL #1   Title Pt will be able to turn head to drive without increasing pain for safety and comfort while driving.     Baseline improved, tight end range    Status Partially Met     PT LONG TERM GOAL #2   Title Pt will be able to reach, lift and use arms for work  without increasing neck pain.    Baseline pain end of the day    Status On-going     PT LONG TERM GOAL #3   Title Pt will be I with HEP for flexibility, posture and strength.    Status On-going     PT LONG TERM GOAL #4   Title Pt will be I with concepts of lifting, sitting posture and make changes to seating to improve alignment   Status Unable to assess               Plan - 12/18/16 1604    Clinical Impression Statement dermatomes & myotomes WFL as pt reported numbness down to L elbow, good coordination noted with eyes closed. numbness increased with retro peddaling of UBE and when in prone with hand behind back but centralized as we performed dermatome/myotome testing. Denied numbness following treatment today. Some soreness noted where STM was performed. Suggested using theracane or tennis ball at home.    PT Treatment/Interventions ADLs/Self Care Home Management;Neuromuscular re-education;Dry needling;Taping;Passive range of motion;Electrical Stimulation;Cryotherapy;Ultrasound;Moist Heat;Therapeutic  activities;Therapeutic exercise;Patient/family education;Manual techniques   PT Next Visit Plan continue with postural stretching, manual, MHP and posture ed    PT Home Exercise Plan neck ROM, chin tuck, scap retract, bilat UE ER, resisted ext, row   Consulted and Agree with Plan of Care Patient      Patient will benefit from skilled therapeutic intervention in order to improve the following deficits and impairments:  Impaired flexibility, Impaired UE functional use, Hypomobility, Decreased strength, Decreased range of motion, Postural dysfunction, Increased fascial restricitons, Pain, Decreased mobility  Visit Diagnosis: Muscle weakness (generalized)  Abnormal posture  Other disturbances of skin sensation  Chronic left shoulder pain  Cervicalgia     Problem List Patient Active Problem List   Diagnosis Date Noted  . SHOULDER PAIN, LEFT 08/27/2007  . FRACTURE, PATELLA 08/27/2007  . INGUINAL HERNIA, HX OF 08/27/2007    Kinzee Happel C. Briseis Aguilera PT, DPT 12/18/16 4:31 PM   [facility] [address] [place], [place], [address] Phone: [phone]   Fax:  [phone]  Name: Anthony Shields MRN: [id number] Date of Birth: May 26, 1960

## 2016-12-24 ENCOUNTER — Ambulatory Visit: Payer: 59 | Admitting: Physical Therapy

## 2016-12-24 ENCOUNTER — Encounter: Payer: Self-pay | Admitting: Physical Therapy

## 2016-12-24 DIAGNOSIS — M542 Cervicalgia: Secondary | ICD-10-CM

## 2016-12-24 DIAGNOSIS — M25512 Pain in left shoulder: Secondary | ICD-10-CM

## 2016-12-24 DIAGNOSIS — R208 Other disturbances of skin sensation: Secondary | ICD-10-CM

## 2016-12-24 DIAGNOSIS — R293 Abnormal posture: Secondary | ICD-10-CM

## 2016-12-24 DIAGNOSIS — M6281 Muscle weakness (generalized): Secondary | ICD-10-CM

## 2016-12-24 DIAGNOSIS — G8929 Other chronic pain: Secondary | ICD-10-CM

## 2016-12-24 NOTE — Therapy (Signed)
[place], [place], [address] Phone: [phone]   Fax:  [phone]  Physical Therapy Treatment  Patient Details  Name: Anthony Shields MRN: [id number] Date of Birth: [birth year] Referring Provider: Dr. Lynelle Doctor   Encounter Date: 12/24/2016      PT End of Session - 12/24/16 0848    Visit Number 6   Number of Visits 16   Date for PT Re-Evaluation 01/22/17   PT Start Time 4888   PT Stop Time 0937   PT Time Calculation (min) 50 min   Activity Tolerance Patient tolerated treatment well   Behavior During Therapy [facility] for tasks assessed/performed      History reviewed. No pertinent past medical history.  History reviewed. No pertinent surgical history.  There were no vitals filed for this visit.      Subjective Assessment - 12/24/16 0848    Subjective Increased pain in bilat upper trap. Fell asleep in recliner with arms overhead and then slept on it funny    Patient Stated Goals Pt would like to be able to lift, use arms and have less pain.    Currently in Pain? Yes   Pain Score 7    Pain Location Neck   Pain Orientation Right;Left   Pain Descriptors / Indicators Sore                         [facility] Adult PT Treatment/Exercise - 12/24/16 0001      Neck Exercises: Supine   Neck Retraction Limitations retraction to head lift     Shoulder Exercises: Supine   Flexion 20 reps   Theraband Level (Shoulder Flexion) Level 3 (Green)   Flexion Limitations + horiz abd tension hold on green tband     Shoulder Exercises: Standing   Row Limitations black tband from floor   Other Standing Exercises D1 flexion green tband   Other Standing Exercises hip hinge with bar     Shoulder Exercises: Stretch   Table Stretch - Abduction 2 reps;30 seconds     Moist Heat Therapy   Number Minutes Moist Heat 15 Minutes  concurrent with estim   Moist Heat Location Cervical     Electrical Stimulation    Electrical Stimulation Location cervical/upper traps   Electrical Stimulation Action IFC   Electrical Stimulation Parameters to tol, 15 min with heat   Electrical Stimulation Goals Pain     Manual Therapy   Manual Therapy Taping   Manual therapy comments manual cervical traction   Soft tissue mobilization Bilat upper trap, suboccipital release   Kinesiotex Facilitate Muscle     Kinesiotix   Facilitate Muscle  scpaular retraction                PT Education - 12/24/16 [id number]    Education provided Yes   Education Details exercise form/rationale, importance of posture, hip hinge   Person(s) Educated Patient   Methods Explanation;Demonstration;Verbal cues;Tactile cues   Comprehension Verbalized understanding;Returned demonstration;Verbal cues required;Tactile cues required;Need further instruction          PT Short Term Goals - 12/10/16 0901      PT SHORT TERM GOAL #1   Title Pt will be I with initial HEP   Status Achieved     PT SHORT TERM GOAL #2   Title Pt will be able to report taking stretch breaks frequently and integrating postural awareness into travel for work.    Status Achieved  PT SHORT TERM GOAL #3   Title Pt will report 25% less sensory symptoms in L UE with driving, sleeping.     Status Achieved           PT Long Term Goals - 12/10/16 0913      PT LONG TERM GOAL #1   Title Pt will be able to turn head to drive without increasing pain for safety and comfort while driving.     Baseline improved, tight end range    Status Partially Met     PT LONG TERM GOAL #2   Title Pt will be able to reach, lift and use arms for work without increasing neck pain.    Baseline pain end of the day    Status On-going     PT LONG TERM GOAL #3   Title Pt will be I with HEP for flexibility, posture and strength.    Status On-going     PT LONG TERM GOAL #4   Title Pt will be I with concepts of lifting, sitting posture and make changes to seating to improve  alignment   Status Unable to assess               Plan - 12/24/16 0924    Clinical Impression Statement Pt with increased pain in bilat upper traps today. Is doing stretches but does not sit with upright posture on a regular basis. Reprots shoulders have gotten better but has not had to do any long trips for work in a while. Emphasized importance of postural alignment to experience benefits of strength training.    PT Treatment/Interventions ADLs/Self Care Home Management;Neuromuscular re-education;Dry needling;Taping;Passive range of motion;Electrical Stimulation;Cryotherapy;Ultrasound;Moist Heat;Therapeutic activities;Therapeutic exercise;Patient/family education;Manual techniques   PT Next Visit Plan postural strengthening, review hip hinge and reaching without slouching   PT Home Exercise Plan neck ROM, chin tuck, scap retract, bilat UE ER, resisted ext, row; upright posture   Consulted and Agree with Plan of Care Patient      Patient will benefit from skilled therapeutic intervention in order to improve the following deficits and impairments:  Impaired flexibility, Impaired UE functional use, Hypomobility, Decreased strength, Decreased range of motion, Postural dysfunction, Increased fascial restricitons, Pain, Decreased mobility  Visit Diagnosis: Muscle weakness (generalized)  Abnormal posture  Other disturbances of skin sensation  Chronic left shoulder pain  Cervicalgia     Problem List Patient Active Problem List   Diagnosis Date Noted  . SHOULDER PAIN, LEFT 08/27/2007  . FRACTURE, PATELLA 08/27/2007  . INGUINAL HERNIA, HX OF 08/27/2007    Jonty Morrical C. Devany Aja PT, DPT 12/24/16 9:29 AM   [facility] [address] [place], [place], [address] Phone: [phone]   Fax:  [phone]  Name: Anthony Shields MRN: [id number] Date of Birth: 1960/10/11

## 2016-12-31 ENCOUNTER — Encounter: Payer: Self-pay | Admitting: Physical Therapy

## 2016-12-31 ENCOUNTER — Ambulatory Visit: Payer: 59 | Admitting: Physical Therapy

## 2016-12-31 DIAGNOSIS — G8929 Other chronic pain: Secondary | ICD-10-CM

## 2016-12-31 DIAGNOSIS — M25512 Pain in left shoulder: Secondary | ICD-10-CM

## 2016-12-31 DIAGNOSIS — M542 Cervicalgia: Secondary | ICD-10-CM

## 2016-12-31 DIAGNOSIS — R293 Abnormal posture: Secondary | ICD-10-CM

## 2016-12-31 DIAGNOSIS — M6281 Muscle weakness (generalized): Secondary | ICD-10-CM

## 2016-12-31 DIAGNOSIS — R208 Other disturbances of skin sensation: Secondary | ICD-10-CM

## 2016-12-31 NOTE — Therapy (Signed)
[place], [place], [address] Phone: [phone]   Fax:  [phone]  Physical Therapy Treatment  Patient Details  Name: Anthony Shields MRN: [id number] Date of Birth: 06-22-1960 Referring Provider: Dr. Lynelle Doctor   Encounter Date: 12/31/2016      PT End of Session - 12/31/16 0850    Visit Number 7   Number of Visits 16   Date for PT Re-Evaluation 01/22/17   PT Start Time 0850   PT Stop Time 0929   PT Time Calculation (min) 39 min   Activity Tolerance Patient tolerated treatment well   Behavior During Therapy [facility] for tasks assessed/performed      History reviewed. No pertinent past medical history.  History reviewed. No pertinent surgical history.  There were no vitals filed for this visit.      Subjective Assessment - 12/31/16 0851    Subjective Denies pain today. Has not done lat pulls due to pain at hernia site. tape was helpful as postural reminder   Currently in Pain? No/denies                         [facility] Adult PT Treatment/Exercise - 12/31/16 0001      Shoulder Exercises: Supine   Theraband Level (Shoulder Horizontal ABduction) Level 3 (Green)   Horizontal ABduction Limitations horizontal & diagonals   Flexion Limitations hands on either end of 2lb weight   Other Supine Exercises rythmic stabs 3x30s each at 120 flx     Shoulder Exercises: Prone   Flexion Limitations prone Y   Horizontal ABduction 1 15 reps;Both   Horizontal ABduction 1 Weight (lbs) 2   Other Prone Exercises row to triceps kicks   Other Prone Exercises thoracic extension     Shoulder Exercises: Standing   Flexion Limitations standing Y red tband   Other Standing Exercises D1 flx red tband single arm      Shoulder Exercises: ROM/Strengthening   UBE (Upper Arm Bike) L1 4 min retro                  PT Short Term Goals - 12/10/16 0901      PT SHORT TERM GOAL #1   Title Pt will be I with  initial HEP   Status Achieved     PT SHORT TERM GOAL #2   Title Pt will be able to report taking stretch breaks frequently and integrating postural awareness into travel for work.    Status Achieved     PT SHORT TERM GOAL #3   Title Pt will report 25% less sensory symptoms in L UE with driving, sleeping.     Status Achieved           PT Long Term Goals - 12/10/16 0913      PT LONG TERM GOAL #1   Title Pt will be able to turn head to drive without increasing pain for safety and comfort while driving.     Baseline improved, tight end range    Status Partially Met     PT LONG TERM GOAL #2   Title Pt will be able to reach, lift and use arms for work without increasing neck pain.    Baseline pain end of the day    Status On-going     PT LONG TERM GOAL #3   Title Pt will be I with HEP for flexibility, posture and strength.    Status On-going  PT LONG TERM GOAL #4   Title Pt will be I with concepts of lifting, sitting posture and make changes to seating to improve alignment   Status Unable to assess               Plan - 12/31/16 0935    Clinical Impression Statement fatigued by exercises today but denied any increased pain or N/T in arms. will continue to benefit from endurance challenges to periscapular region for postural endurance.    PT Treatment/Interventions ADLs/Self Care Home Management;Neuromuscular re-education;Dry needling;Taping;Passive range of motion;Electrical Stimulation;Cryotherapy;Ultrasound;Moist Heat;Therapeutic activities;Therapeutic exercise;Patient/family education;Manual techniques   PT Next Visit Plan postural strengthening, review hip hinge and reaching without slouching   PT Home Exercise Plan neck ROM, chin tuck, scap retract, bilat UE ER, resisted ext, row; upright posture; prone Y & T;    Consulted and Agree with Plan of Care Patient      Patient will benefit from skilled therapeutic intervention in order to improve the following  deficits and impairments:  Impaired flexibility, Impaired UE functional use, Hypomobility, Decreased strength, Decreased range of motion, Postural dysfunction, Increased fascial restricitons, Pain, Decreased mobility  Visit Diagnosis: Muscle weakness (generalized)  Abnormal posture  Other disturbances of skin sensation  Chronic left shoulder pain  Cervicalgia     Problem List Patient Active Problem List   Diagnosis Date Noted  . SHOULDER PAIN, LEFT 08/27/2007  . FRACTURE, PATELLA 08/27/2007  . INGUINAL HERNIA, HX OF 08/27/2007    Aliese Brannum C. Britlyn Martine PT, DPT 12/31/16 10:16 AM   [facility] [address] [place], [place], [address] Phone: [phone]   Fax:  [phone]  Name: RYANE CANAVAN MRN: [id number] Date of Birth: 05-26-1960

## 2017-01-07 ENCOUNTER — Encounter: Payer: Self-pay | Admitting: Physical Therapy

## 2017-01-07 ENCOUNTER — Ambulatory Visit: Payer: 59 | Admitting: Physical Therapy

## 2017-01-07 DIAGNOSIS — R293 Abnormal posture: Secondary | ICD-10-CM

## 2017-01-07 DIAGNOSIS — M6281 Muscle weakness (generalized): Secondary | ICD-10-CM | POA: Diagnosis not present

## 2017-01-07 DIAGNOSIS — M25512 Pain in left shoulder: Secondary | ICD-10-CM

## 2017-01-07 DIAGNOSIS — M542 Cervicalgia: Secondary | ICD-10-CM

## 2017-01-07 DIAGNOSIS — G8929 Other chronic pain: Secondary | ICD-10-CM

## 2017-01-07 DIAGNOSIS — R208 Other disturbances of skin sensation: Secondary | ICD-10-CM

## 2017-01-07 NOTE — Therapy (Signed)
[place], [place], [address] Phone: [phone]   Fax:  [phone]  Physical Therapy Treatment/Discharge Summary  Patient Details  Name: Anthony Shields MRN: [id number] Date of Birth: 02-14-1961 Referring Provider: Dr. Lynelle Doctor   Encounter Date: 01/07/2017      PT End of Session - 01/07/17 0850    Visit Number 8   Number of Visits 16   Date for PT Re-Evaluation 01/22/17   PT Start Time 1030   PT Stop Time 0929   PT Time Calculation (min) 42 min   Activity Tolerance Patient tolerated treatment well   Behavior During Therapy [facility] for tasks assessed/performed      History reviewed. No pertinent past medical history.  History reviewed. No pertinent surgical history.  There were no vitals filed for this visit.      Subjective Assessment - 01/07/17 0850    Subjective pt denies pain today. I don't think it has hurt all weekend, maybe one episode of the burning.    Patient Stated Goals Pt would like to be able to lift, use arms and have less pain.    Currently in Pain? No/denies            [facility] PT Assessment - 01/07/17 0001      AROM   Cervical Flexion 50   Cervical Extension 42   Cervical - Right Side Bend 40   Cervical - Left Side Bend 38   Cervical - Right Rotation 54   Cervical - Left Rotation 62     Strength   Left Shoulder Flexion 5/5   Left Shoulder ABduction 5/5   Left Elbow Flexion 5/5   Left Elbow Extension 5/5     Palpation   Palpation comment TTP L upper trap                     [facility] Adult PT Treatment/Exercise - 01/07/17 0001      Shoulder Exercises: Seated   External Rotation 20 reps   Theraband Level (Shoulder External Rotation) Level 2 (Red)     Shoulder Exercises: Prone   Flexion Limitations prone Y   Horizontal ABduction 1 15 reps;Both     Shoulder Exercises: Standing   Extension 10 reps   Theraband Level (Shoulder Extension) Level 2 (Red)    Row 15 reps   Theraband Level (Shoulder Row) Level 2 (Red)     Shoulder Exercises: ROM/Strengthening   UBE (Upper Arm Bike) L1 4 min retro     Shoulder Exercises: Stretch   Other Shoulder Stretches upper trap & levator   Other Shoulder Stretches SNAG rotation                PT Education - 01/07/17 0930    Education provided Yes   Education Details exercise form/rationale, goals/MMT discussion, importance of continued exercise.    Person(s) Educated Patient   Methods Explanation;Demonstration;Tactile cues;Verbal cues   Comprehension Verbalized understanding;Returned demonstration;Verbal cues required;Tactile cues required          PT Short Term Goals - 12/10/16 0901      PT SHORT TERM GOAL #1   Title Pt will be I with initial HEP   Status Achieved     PT SHORT TERM GOAL #2   Title Pt will be able to report taking stretch breaks frequently and integrating postural awareness into travel for work.    Status Achieved     PT SHORT TERM GOAL #3  Title Pt will report 25% less sensory symptoms in L UE with driving, sleeping.     Status Achieved           PT Long Term Goals - 01/07/17 0853      PT LONG TERM GOAL #1   Title Pt will be able to turn head to drive without increasing pain for safety and comfort while driving.     Baseline feels tight at end range on L side   Time 8   Period Weeks   Status Partially Met   Target Date 01/22/17     PT LONG TERM GOAL #2   Title Pt will be able to reach, lift and use arms for work without increasing neck pain.    Baseline able without pain   Status Achieved     PT LONG TERM GOAL #3   Title Pt will be I with HEP for flexibility, posture and strength.    Baseline not doing it daily, some pulling at hernia slows him down   Status Achieved     PT LONG TERM GOAL #4   Title Pt will be I with concepts of lifting, sitting posture and make changes to seating to improve alignment   Baseline independent and able to correct    Status Achieved               Plan - 01/07/17 0924    Clinical Impression Statement pt has made significant improvement in postural alignment and decrease in overall pain. Cont to have tightness along Left upper trap that results in tightness in cervical rotation. Denies sensations of N/T at this point. will be d/c to independent program and contact us if he has any further questions.    PT Treatment/Interventions ADLs/Self Care Home Management;Neuromuscular re-education;Dry needling;Taping;Passive range of motion;Electrical Stimulation;Cryotherapy;Ultrasound;Moist Heat;Therapeutic activities;Therapeutic exercise;Patient/family education;Manual techniques   PT Home Exercise Plan neck ROM, chin tuck, scap retract, bilat UE ER, resisted ext, row; upright posture; prone Y & T;    Consulted and Agree with Plan of Care Patient      Patient will benefit from skilled therapeutic intervention in order to improve the following deficits and impairments:  Impaired flexibility, Impaired UE functional use, Hypomobility, Decreased strength, Decreased range of motion, Postural dysfunction, Increased fascial restricitons, Pain, Decreased mobility  Visit Diagnosis: Muscle weakness (generalized)  Abnormal posture  Other disturbances of skin sensation  Chronic left shoulder pain  Cervicalgia     Problem List Patient Active Problem List   Diagnosis Date Noted  . SHOULDER PAIN, LEFT 08/27/2007  . FRACTURE, PATELLA 08/27/2007  . INGUINAL HERNIA, HX OF 08/27/2007   PHYSICAL THERAPY DISCHARGE SUMMARY  Visits from Start of Care: 8  Current functional level related to goals / functional outcomes: See above   Remaining deficits: See above   Education / Equipment: Anatomy of condition, POC, HEP, exercise form/rationale  Plan: Patient agrees to discharge.  Patient goals were met. Patient is being discharged due to meeting the stated rehab goals.  ?????     Torben Soloway C. Gracynn Rajewski PT,  DPT 01/07/17 9:39 AM   [facility] [address] [place], [place], [address] Phone: [phone]   Fax:  [phone]  Name: Anthony Shields MRN: [id number] Date of Birth: 05-Dec-1960

## 2017-01-14 ENCOUNTER — Encounter: Payer: 59 | Admitting: Physical Therapy

## 2017-04-10 DIAGNOSIS — H04123 Dry eye syndrome of bilateral lacrimal glands: Secondary | ICD-10-CM | POA: Diagnosis not present

## 2017-06-13 DIAGNOSIS — E785 Hyperlipidemia, unspecified: Secondary | ICD-10-CM | POA: Diagnosis not present

## 2017-06-13 DIAGNOSIS — F17210 Nicotine dependence, cigarettes, uncomplicated: Secondary | ICD-10-CM | POA: Diagnosis not present

## 2017-06-13 DIAGNOSIS — Z0000 Encounter for general adult medical examination without abnormal findings: Secondary | ICD-10-CM | POA: Diagnosis not present

## 2017-09-05 DIAGNOSIS — K1329 Other disturbances of oral epithelium, including tongue: Secondary | ICD-10-CM | POA: Diagnosis not present

## 2017-09-12 DIAGNOSIS — K1329 Other disturbances of oral epithelium, including tongue: Secondary | ICD-10-CM | POA: Diagnosis not present

## 2017-10-08 DIAGNOSIS — K4091 Unilateral inguinal hernia, without obstruction or gangrene, recurrent: Secondary | ICD-10-CM | POA: Diagnosis not present

## 2020-08-10 ENCOUNTER — Encounter (HOSPITAL_COMMUNITY): Payer: Self-pay | Admitting: Emergency Medicine

## 2020-08-10 ENCOUNTER — Emergency Department (HOSPITAL_COMMUNITY): Payer: Managed Care, Other (non HMO)

## 2020-08-10 ENCOUNTER — Emergency Department (HOSPITAL_COMMUNITY)
Admission: EM | Admit: 2020-08-10 | Discharge: 2020-08-10 | Disposition: A | Discharge status: Home / Self Care | Payer: Managed Care, Other (non HMO) | Attending: Emergency Medicine | Admitting: Emergency Medicine

## 2020-08-10 DIAGNOSIS — R110 Nausea: Secondary | ICD-10-CM | POA: Diagnosis not present

## 2020-08-10 DIAGNOSIS — R002 Palpitations: Secondary | ICD-10-CM | POA: Diagnosis present

## 2020-08-10 DIAGNOSIS — R251 Tremor, unspecified: Secondary | ICD-10-CM | POA: Diagnosis not present

## 2020-08-10 DIAGNOSIS — F10230 Alcohol dependence with withdrawal, uncomplicated: Secondary | ICD-10-CM

## 2020-08-10 DIAGNOSIS — I4891 Unspecified atrial fibrillation: Secondary | ICD-10-CM | POA: Diagnosis not present

## 2020-08-10 DIAGNOSIS — F10239 Alcohol dependence with withdrawal, unspecified: Secondary | ICD-10-CM | POA: Diagnosis not present

## 2020-08-10 DIAGNOSIS — Z20822 Contact with and (suspected) exposure to covid-19: Secondary | ICD-10-CM | POA: Diagnosis not present

## 2020-08-10 DIAGNOSIS — Z7982 Long term (current) use of aspirin: Secondary | ICD-10-CM | POA: Diagnosis not present

## 2020-08-10 DIAGNOSIS — F10930 Alcohol use, unspecified with withdrawal, uncomplicated: Secondary | ICD-10-CM

## 2020-08-10 LAB — CBC
HCT: 47.4 % (ref 39.0–52.0)
Hemoglobin: 15.9 g/dL (ref 13.0–17.0)
MCH: 31 pg (ref 26.0–34.0)
MCHC: 33.5 g/dL (ref 30.0–36.0)
MCV: 92.4 fL (ref 80.0–100.0)
Platelets: 209 10*3/uL (ref 150–400)
RBC: 5.13 MIL/uL (ref 4.22–5.81)
RDW: 13.8 % (ref 11.5–15.5)
WBC: 7.6 10*3/uL (ref 4.0–10.5)
nRBC: 0 % (ref 0.0–0.2)

## 2020-08-10 LAB — RESP PANEL BY RT-PCR (FLU A&B, COVID) ARPGX2
Influenza A by PCR: NEGATIVE
Influenza B by PCR: NEGATIVE
SARS Coronavirus 2 by RT PCR: NEGATIVE

## 2020-08-10 LAB — BASIC METABOLIC PANEL
Anion gap: 15 (ref 5–15)
BUN: 17 mg/dL (ref 6–20)
CO2: 22 mmol/L (ref 22–32)
Calcium: 9 mg/dL (ref 8.9–10.3)
Chloride: 103 mmol/L (ref 98–111)
Creatinine, Ser: 1.18 mg/dL (ref 0.61–1.24)
GFR, Estimated: >60 mL/min (ref >60)
Glucose, Bld: 110 mg/dL — ABNORMAL HIGH (ref 70–99)
Potassium: 3.8 mmol/L (ref 3.5–5.1)
Sodium: 140 mmol/L (ref 135–145)

## 2020-08-10 LAB — TSH: TSH: 0.802 u[IU]/mL (ref 0.350–4.500)

## 2020-08-10 LAB — ETHANOL: Alcohol, Ethyl (B): <10 mg/dL (ref <10)

## 2020-08-10 LAB — MAGNESIUM: Magnesium: 1.7 mg/dL (ref 1.7–2.4)

## 2020-08-10 LAB — TROPONIN I (HIGH SENSITIVITY)
Troponin I (High Sensitivity): 5 ng/L (ref <18)
Troponin I (High Sensitivity): 10 ng/L (ref <18)

## 2020-08-10 MED ORDER — DILTIAZEM LOAD VIA INFUSION
10.0000 mg | Freq: Once | INTRAVENOUS | Status: AC
  Administered 2020-08-10: 10 mg via INTRAVENOUS
  Filled 2020-08-10: qty 10

## 2020-08-10 MED ORDER — DILTIAZEM HCL-DEXTROSE 125-5 MG/125ML-% IV SOLN (PREMIX)
5.0000 mg/h | INTRAVENOUS | Status: DC
  Administered 2020-08-10: 5 mg/h via INTRAVENOUS
  Filled 2020-08-10: qty 125

## 2020-08-10 MED ORDER — CHLORDIAZEPOXIDE HCL 25 MG PO CAPS: ORAL_CAPSULE | ORAL | 0 refills | Status: DC

## 2020-08-10 MED ORDER — DILTIAZEM HCL 30 MG PO TABS: 30.0000 mg | ORAL_TABLET | Freq: Four times a day (QID) | ORAL | 0 refills | Status: DC

## 2020-08-10 MED ORDER — LORAZEPAM 2 MG/ML IJ SOLN
1.0000 mg | Freq: Once | INTRAMUSCULAR | Status: AC
  Administered 2020-08-10: 1 mg via INTRAVENOUS
  Filled 2020-08-10: qty 1

## 2020-08-10 NOTE — ED Triage Notes (Signed)
Pt  Here from home with c/o hear rate high , HR 174 today , no chest pain with some slight son and left arm numbness

## 2020-08-10 NOTE — ED Provider Notes (Signed)
[facility] EMERGENCY DEPARTMENT Provider Note   CSN: [id number] Arrival date & time: 08/10/20  1115     History Palpitations   Anthony Shields is a 60 y.o. male with past medical history who presents for evaluation of palpitations.  Patient states he has felt "off" the last few days.  He was alerted yesterday morning by his watch that his heart rate was elevated.  He thought this was due to to recent attempt at cessation of alcohol.  He does drink alcohol daily.  Typically drinks at least a six-pack of beer.  He denies any prior history of DTs or withdrawal seizures.  He felt nauseous.  He thought his palpitations would self resolve however they persisted this morning thus prompting him to come here to the emergency department for evaluation  He denies any chest pain however does state he feels intermittently short of breath when his palpitations increase. Denies any current CP, SOB. He denies headache, lightheadedness, dizziness, abdominal pain, redness or paresthesias or weakness.  Denies prior history of A. Fib, arrhythmias.  He is not anticoagulated.  Denies additional aggravating or alleviating factors. Does admit to diaphoresis since arrival to ED however patient states "I think this is from all the excitement."  No history of PE or DVT.  No unilateral leg swelling, redness or warmth.  No recent surgery or immobilization or malignancy.  Does state that he has stopped alcohol previously without any issues such as tremors, emesis.  History obtained from patient and past medical records.  No interpreter used  HPI     History reviewed. No pertinent past medical history.  Patient Active Problem List   Diagnosis Date Noted  . SHOULDER PAIN, LEFT 08/27/2007  . FRACTURE, PATELLA 08/27/2007  . INGUINAL HERNIA, HX OF 08/27/2007    History reviewed. No pertinent surgical history.     No family history on file.     Home Medications Prior to Admission medications    Medication Sig Start Date End Date Taking? Authorizing Provider  aspirin 81 MG chewable tablet Chew by mouth daily.    [provider]  atorvastatin (LIPITOR) 10 MG tablet Take 10 mg by mouth daily.    [provider]    Allergies    Patient has no allergy information on record.  Review of Systems   Review of Systems  Constitutional: Positive for diaphoresis and fatigue. Negative for activity change and appetite change.  HENT: Negative.   Respiratory: Positive for shortness of breath.   Cardiovascular: Positive for palpitations. Negative for chest pain and leg swelling.  Gastrointestinal: Negative.   Genitourinary: Negative.   Musculoskeletal: Negative.   Skin: Negative.  Negative for color change.  Neurological: Negative.   All other systems reviewed and are negative.   Physical Exam Updated Vital Signs BP 126/78   Pulse 83   Temp 98.6 F (37 C) (Oral)   Resp (!) 21   SpO2 94%   Physical Exam Vitals and nursing note reviewed.  Constitutional:      General: He is not in acute distress.    Appearance: He is well-developed. He is diaphoretic (Slight diaphoresis). He is not toxic-appearing.  HENT:     Head: Normocephalic and atraumatic.     Nose: Nose normal.     Mouth/Throat:     Mouth: Mucous membranes are moist.  Eyes:     Pupils: Pupils are equal, round, and reactive to light.  Cardiovascular:     Rate and Rhythm: Tachycardia  present. Rhythm irregular.     Pulses: Normal pulses.     Heart sounds: Normal heart sounds.  Pulmonary:     Effort: Pulmonary effort is normal. No respiratory distress.     Breath sounds: Normal breath sounds.     Comments: Speaks in full sentences without difficulty.  Clear to auscultation bilaterally Abdominal:     General: Bowel sounds are normal. There is no distension.     Palpations: Abdomen is soft.     Comments: Soft, nontender without rebound or guarding  Musculoskeletal:        General: Normal range of  motion.     Cervical back: Normal range of motion and neck supple.     Comments: No bony tenderness.  Moves all 4 extremities at difficulty.  Compartments soft  Skin:    General: Skin is warm.     Capillary Refill: Capillary refill takes less than 2 seconds.  Neurological:     General: No focal deficit present.     Mental Status: He is alert and oriented to person, place, and time.     Sensory: Sensation is intact.     Motor: Tremor present. No weakness or seizure activity.     Gait: Gait is intact.     Comments: Mild tremors to bilateral upper extremities.  No asterixis.     ED Results / Procedures / Treatments   Labs (all labs ordered are listed, but only abnormal results are displayed) Labs Reviewed  BASIC METABOLIC PANEL - Abnormal; Notable for the following components:      Result Value   Glucose, Bld 110 (*)    All other components within normal limits  RESP PANEL BY RT-PCR (FLU A&B, COVID) ARPGX2  MAGNESIUM  CBC  TSH  ETHANOL  TROPONIN I (HIGH SENSITIVITY)  TROPONIN I (HIGH SENSITIVITY)    EKG EKG Interpretation  Date/Time:  Wednesday August 10 2020 13:19:38 EDT Ventricular Rate:  95 PR Interval:  128 QRS Duration: 99 QT Interval:  376 QTC Calculation: 473 R Axis:   221 Text Interpretation: Sinus rhythm Consider right ventricular hypertrophy sinus replacing afib Confirmed by Meridee Score [phone]) on 08/10/2020 1:24:06 PM   Radiology DG Chest Portable 1 View  Result Date: 08/10/2020 CLINICAL DATA:  Tachycardia and shortness of breath. EXAM: PORTABLE CHEST 1 VIEW COMPARISON:  None. FINDINGS: The lungs are clear without focal pneumonia, edema, pneumothorax or pleural effusion. The cardiopericardial silhouette is within normal limits for size. The visualized bony structures of the thorax show no acute abnormality. Telemetry leads overlie the chest. IMPRESSION: No active disease. Electronically Signed   By: Kennith Center M.D.   On: 08/10/2020 12:51     Procedures .Critical Care Performed by: Linwood Dibbles, PA-C Authorized by: Linwood Dibbles, PA-C   Critical care provider statement:    Critical care time (minutes):  45   Critical care was necessary to treat or prevent imminent or life-threatening deterioration of the following conditions:  Cardiac failure and metabolic crisis   Critical care was time spent personally by me on the following activities:  Discussions with consultants, evaluation of patient's response to treatment, examination of patient, ordering and performing treatments and interventions, ordering and review of laboratory studies, ordering and review of radiographic studies, pulse oximetry, re-evaluation of patient's condition, obtaining history from patient or surrogate and review of old charts     Medications Ordered in ED Medications  LORazepam (ATIVAN) injection 1 mg (1 mg Intravenous Given 08/10/20 1232)  diltiazem (CARDIZEM)  1 mg/mL load via infusion 10 mg (10 mg Intravenous Bolus from Bag 08/10/20 1234)    ED Course  I have reviewed the triage vital signs and the nursing notes.  Pertinent labs & imaging results that were available during my care of the patient were reviewed by me and considered in my medical decision making (see chart for details).  60 year old here for evaluation of palpitations.  He is afebrile, nonseptic appearing.  On arrival patient found new onset A. fib with RVR.  Unclear timing of when this started as he has felt "off" for the last few days.  Do not feel patient is a candidate for cardioversion.  He was alerted yesterday morning that his heart rate was elevated.  He has no clinical evidence of DVT on exam.  His heart and lungs are clear.  His abdomen is soft, nontender.  He has no bony tenderness and is neurovascularly intact.  Of note patient does have history of daily EtOH use.  Last drink approximately 36 hours ago.  Prior history of DTs or withdrawal seizures.  Patient is  slightly diaphoretic and tremulous here in the ED.  We will plan on CIWA/ Ativan. Will give Ativan here for tremors. No seizure like activity.  Also started on Cardizem bolus and drip for new onset A. fib with RVR.  Plan on labs, imaging and reassess.  Labs and imaging personally reviewed and interpreted:  CBC without leukocytosis BMP glucose 110 no additional electrolyte, renal or liver abnormality TRH Mag 1.7 Trop 5 COVID negative Ethanol <10 DG chest without cardiomegaly, pulm edema, pneumothorax, infiltrates EKG with afib with RVR  Patient reassessed. CIWA 4. Now in NSR after starting Cardizem drip and bolus.   Patient reassessed.  Feels much improved after Ativan as well as Cardizem drip.  No complaints.  Patient does state he would like to go home would prefer to not be admitted to the hospital.  We will pause Cardizem drip, observe patient.   If patient does not have recurrence of his atrial fibrillation, ambulatory to any hypoxia plan to DC home can start on p.o. meds and close Cards follow up.  We will also need Librium taper as well.  Orders for outpatient A. fib clinic have been placed.   Care transferred to [facility] NP who will follow up on remaining labs and dispo.     Clinical Course as of 08/10/20 1529  Wed Aug 10, 2020  842 60 year old male here for feeling tremulous heart rate lightheadedness in the setting of discontinuing alcohol a few days ago.  He is in rapid A. fib and hypertensive.  Getting rate control and Ativan for likely some alcohol withdrawal. [MB]    Clinical Course User Index [MB] Terrilee Files, MD   MDM Rules/Calculators/A&P     CHA2DS2-VASc Score: 0                      Final Clinical Impression(s) / ED Diagnoses Final diagnoses:  Atrial fibrillation with RVR (HCC)  Alcohol withdrawal syndrome without complication [facility])    Rx / DC Orders ED Discharge Orders         Ordered    Amb referral to AFIB Clinic        08/10/20 1149            Eva Vallee A, PA-C 08/10/20 1529    Terrilee Files, MD 08/10/20 2109

## 2020-08-10 NOTE — ED Triage Notes (Signed)
Emergency Medicine Provider Triage Evaluation Note  Anthony Shields , a 60 y.o. male  was evaluated in triage.  Pt complains of high heart rate. He reports his smart watch alerted him around 9 AM yesterday morning that his heart rate was elevated. He also complains of feeling like his heart is fluttering and "off." He has never experienced this in the past. Denies chest pain or SOB. No hx of A fib. Pt is a current everyday drinker and drinks about a pack of beer per day; last drank 2 days ago. Has never been in alcohol withdrawal. Denies hallucinations.  Review of Systems  Positive: + tachycardia, palpitations Negative: - chest pain, - SOB  Physical Exam  BP (!) 145/112   Pulse (!) 170   Temp 98.6 F (37 C) (Oral)   Resp (!) 24   SpO2 100%  Gen:   Awake, no distress. Tremulous.  HEENT:  Atraumatic  Resp:  Normal effort  Cardiac:  IRR with HR in the 170s Abd:   Nondistended, nontender  MSK:   Moves extremities without difficulty  Neuro:  Speech clear   Medical Decision Making  Medically screening exam initiated at 11:46 AM.  Appropriate orders placed.  Anthony Shields was informed that the remainder of the evaluation will be completed by another provider, this initial triage assessment does not replace that evaluation, and the importance of remaining in the ED until their evaluation is complete.  Clinical Impression  60 year old male without known hx of A fib who presents with complaints of high heart rate. Found to be in A fib with RVR currently with rates in the 170s. He also has tremors on exam with hx of heavy alcohol use, last used 2 days ago. Concern for withdrawal. Triage Nurse Anthony Shields made aware that pt needs higher priority to be seen; will be moved to trauma C soon.    Anthony Rockers, PA-C 08/10/20 1148

## 2020-08-10 NOTE — ED Notes (Signed)
Pt ambulated and hr was [address] st.  PA notified.  Pt denied dizziness, etc.

## 2020-08-10 NOTE — Discharge Instructions (Signed)
Please refer to the attached instructions. Take medication as directed. The [facility]ibrillation [facility] should contact you to schedule your follow-up appointment. Please call them if you do not hear from them by tomorrow afternoon.

## 2020-08-10 NOTE — ED Provider Notes (Signed)
Anthony Shields received in sign out from B. Henerly, PA. Anthony Shields seen today for new onset of atrial fibrillation. He is a daily alcohol user, but has not had any alcohol for about 36 hours. Anthony Shields was slightly tremulous on initial exam. Rate controlled and Anthony Shields converted to NSR with loading dose of cardizem and cardizem drip. Anthony Shields given librium with improvement in tremors. Cardizem drip was discontinued, and Anthony Shields remained in NSR without recurrence of A-fib. Labs reviewed. Chadvasc score of 1. Anticoagulation not needed at present.   Anthony Shields appears safe for discharge at this time. Anthony Shields has been referred to the afib clinic. Anthony Shields will be started on cardizem 30 mg qid, and sent home with librium taper. Follow-up with cardiology and PCP.  Anthony Shields discussed with Dr. Dalene Seltzer prior to discharge.  Results for orders placed or performed during the hospital encounter of 08/10/20  Resp Panel by RT-PCR (Flu A&B, Covid) Nasopharyngeal Swab   Specimen: Nasopharyngeal Swab; Nasopharyngeal(NP) swabs in vial transport medium  Result Value Ref Range   SARS Coronavirus 2 by RT PCR NEGATIVE NEGATIVE   Influenza A by PCR NEGATIVE NEGATIVE   Influenza B by PCR NEGATIVE NEGATIVE  Basic metabolic panel  Result Value Ref Range   Sodium 140 135 - 145 mmol/L   Potassium 3.8 3.5 - 5.1 mmol/L   Chloride 103 98 - 111 mmol/L   CO2 22 22 - 32 mmol/L   Glucose, Bld 110 (H) 70 - 99 mg/dL   BUN 17 6 - 20 mg/dL   Creatinine, Ser 7.78 0.61 - 1.24 mg/dL   Calcium 9.0 8.9 - 24.2 mg/dL   GFR, Estimated >35 >36 mL/min   Anion gap 15 5 - 15  Magnesium  Result Value Ref Range   Magnesium 1.7 1.7 - 2.4 mg/dL  CBC  Result Value Ref Range   WBC 7.6 4.0 - 10.5 K/uL   RBC 5.13 4.22 - 5.81 MIL/uL   Hemoglobin 15.9 13.0 - 17.0 g/dL   HCT 14.4 31.5 - 40.0 %   MCV 92.4 80.0 - 100.0 fL   MCH 31.0 26.0 - 34.0 pg   MCHC 33.5 30.0 - 36.0 g/dL   RDW 86.7 61.9 - 50.9 %   Platelets 209 150 - 400 K/uL   nRBC 0.0 0.0 - 0.2 %   TSH  Result Value Ref Range   TSH 0.802 0.350 - 4.500 uIU/mL  Ethanol  Result Value Ref Range   Alcohol, Ethyl (B) <10 <10 mg/dL  Troponin I (High Sensitivity)  Result Value Ref Range   Troponin I (High Sensitivity) 5 <18 ng/L  Troponin I (High Sensitivity)  Result Value Ref Range   Troponin I (High Sensitivity) 10 <18 ng/L   DG Chest Portable 1 View  Result Date: 08/10/2020 CLINICAL DATA:  Tachycardia and shortness of breath. EXAM: PORTABLE CHEST 1 VIEW COMPARISON:  None. FINDINGS: The lungs are clear without focal pneumonia, edema, pneumothorax or pleural effusion. The cardiopericardial silhouette is within normal limits for size. The visualized bony structures of the thorax show no acute abnormality. Telemetry leads overlie the chest. IMPRESSION: No active disease. Electronically Signed   By: Kennith Center M.D.   On: 08/10/2020 12:51      Felicie Morn, NP 08/10/20 1703    Alvira Monday, MD 08/12/20 [phone]

## 2020-08-18 ENCOUNTER — Ambulatory Visit (HOSPITAL_COMMUNITY)
Admission: RE | Admit: 2020-08-18 | Discharge: 2020-08-18 | Disposition: A | Discharge status: Home / Self Care | Payer: Managed Care, Other (non HMO) | Source: Ambulatory Visit | Attending: Nurse Practitioner | Admitting: Nurse Practitioner

## 2020-08-18 ENCOUNTER — Encounter (HOSPITAL_COMMUNITY): Payer: Self-pay | Admitting: Nurse Practitioner

## 2020-08-18 ENCOUNTER — Other Ambulatory Visit: Payer: Self-pay

## 2020-08-18 VITALS — BP 118/62 | HR 92 | Ht 68.0 in | Wt 149.6 lb

## 2020-08-18 DIAGNOSIS — D6869 Other thrombophilia: Secondary | ICD-10-CM

## 2020-08-18 DIAGNOSIS — Z79899 Other long term (current) drug therapy: Secondary | ICD-10-CM | POA: Insufficient documentation

## 2020-08-18 DIAGNOSIS — I480 Paroxysmal atrial fibrillation: Secondary | ICD-10-CM | POA: Diagnosis present

## 2020-08-18 DIAGNOSIS — F17210 Nicotine dependence, cigarettes, uncomplicated: Secondary | ICD-10-CM | POA: Diagnosis not present

## 2020-08-18 MED ORDER — DILTIAZEM HCL 30 MG PO TABS: ORAL_TABLET | ORAL | 1 refills | Status: DC

## 2020-08-18 NOTE — Patient Instructions (Signed)
Cardizem 30mg  -- take 1 tablet every 4 hours AS NEEDED for AFib heart rate >100 as long as blood pressure >100.

## 2020-08-18 NOTE — Progress Notes (Signed)
Primary Care Physician: Soundra Pilon, FNP Referring Physician: ER f/u    Anthony Shields is a 60 y.o. male with a h/o of alcohol use, new onset afib, diagnosed with ER visit 08/10/20. He noted elevated HR in the 130's and presented to the ER. He received IV dilt in the ER and converted. Daily alcohol use, has not used for 36 hours. Tremors noted  in the ER. Received a dose of librium in the ER and sent home with short  Taper.   In the office today, he is in SR. Has not noted any further afib. He has cut back on alcohol. He also smokes cigarettes and mod caffeine use. Sleep apnea in the past but has not used CPAP in sometime. He was sent home on 30 mg Cardizem every 6 hours but has finished rx. Will make prn going forward.   Today, he denies symptoms of palpitations, chest pain, shortness of breath, orthopnea, PND, lower extremity edema, dizziness, presyncope, syncope, or neurologic sequela. The patient is tolerating medications without difficulties and is otherwise without complaint today.   No past medical history on file. No past surgical history on file.  Current Outpatient Medications  Medication Sig Dispense Refill  . atorvastatin (LIPITOR) 10 MG tablet Take 20 mg by mouth daily.    Marland Kitchen diltiazem (CARDIZEM) 30 MG tablet Take 1 tablet every 4 hours AS NEEDED for AF heart rate >100 30 tablet 1   No current facility-administered medications for this encounter.    Not on File  Social History   Socioeconomic History  . Marital status: Married    Spouse name: Not on file  . Number of children: Not on file  . Years of education: Not on file  . Highest education level: Not on file  Occupational History  . Not on file  Tobacco Use  . Smoking status: Current Every Day Smoker    Packs/day: 0.50    Years: 35.00    Pack years: 17.50    Types: Cigarettes  . Smokeless tobacco: Never Used  Substance and Sexual Activity  . Alcohol use: Yes    Alcohol/week: 21.0 standard drinks     Types: 21 Cans of beer per week  . Drug use: Yes    Frequency: 3.0 times per week    Types: Marijuana  . Sexual activity: Not on file  Other Topics Concern  . Not on file  Social History Narrative  . Not on file   Social Determinants of Health   Financial Resource Strain: Not on file  Food Insecurity: Not on file  Transportation Needs: Not on file  Physical Activity: Not on file  Stress: Not on file  Social Connections: Not on file  Intimate Partner Violence: Not on file    No family history on file.  ROS- All systems are reviewed and negative except as per the HPI above  Physical Exam: Vitals:   08/18/20 0909  BP: 118/62  Pulse: 92  Weight: 67.9 kg  Height: 5\' 8"  (1.727 m)   Wt Readings from Last 3 Encounters:  08/18/20 67.9 kg  05/01/05 73.5 kg    Labs: Lab Results  Component Value Date   NA 140 08/10/2020   K 3.8 08/10/2020   CL 103 08/10/2020   CO2 22 08/10/2020   GLUCOSE 110 (H) 08/10/2020   BUN 17 08/10/2020   CREATININE 1.18 08/10/2020   CALCIUM 9.0 08/10/2020   MG 1.7 08/10/2020   No results found for: INR No  results found for: CHOL, HDL, LDLCALC, TRIG   GEN- The patient is well appearing, alert and oriented x 3 today.   Head- normocephalic, atraumatic Eyes-  Sclera clear, conjunctiva pink Ears- hearing intact Oropharynx- clear Neck- supple, no JVP Lymph- no cervical lymphadenopathy Lungs- Clear to ausculation bilaterally, normal work of breathing Heart- Regular rate and rhythm, no murmurs, rubs or gallops, PMI not laterally displaced GI- soft, NT, ND, + BS Extremities- no clubbing, cyanosis, or edema MS- no significant deformity or atrophy Skin- no rash or lesion Psych- euthymic mood, full affect Neuro- strength and sensation are intact  EKG-NSR at 92 bpm  pr int 126 ms, qrs int 96 ms, qtc 482 ms  Echo -no previous to review  EKG on arrival to ER showed afib with v rate of 174 bpm   Assessment and Plan: 1. New onset afib General  eduction re afib  Will rx 30 mg Cardizem  to use as needed afib breakthrough Echo ordered  2. Lifestyle issues Encouraged no more than 2 alcoholic drinks a week  Smoking cessation encouraged Reduce caffeine intake  Repeat sleep study for snoring previous dx sleep apnea, pt stopped  treatment   with cpap yrs ago  Regular exercise encouraged   E. CHA2DS2VASc score of 0  For now does not require daily anticoagulation  I will see back in 4-6 weeks   Lupita Leash [facility] [address] [place], [place] [address] [phone]

## 2020-08-26 ENCOUNTER — Telehealth: Payer: Self-pay | Admitting: *Deleted

## 2020-08-26 NOTE — Telephone Encounter (Signed)
-----   Message from Shona Simpson, RN sent at 08/18/2020  2:55 PM EDT ----- Regarding: sleep study Pt for sleep study for afib, snoring and previously diagnosed sleep apnea per donna carroll Thanks [employer]

## 2020-08-26 NOTE — Telephone Encounter (Signed)
Prior Authorization for Split night sent to [facility] via Phone. Reference # [id number]

## 2020-09-08 ENCOUNTER — Other Ambulatory Visit: Payer: Self-pay | Admitting: Nurse Practitioner

## 2020-09-08 DIAGNOSIS — I480 Paroxysmal atrial fibrillation: Secondary | ICD-10-CM

## 2020-09-08 NOTE — Telephone Encounter (Signed)
Per [employer]'s automated system no PA is required for HST. Phone confirmation # [id number]. Left message on patient's VM to check MyChart message for HST appointment details. He can also return a call to me for questions and/or concerns.

## 2020-09-08 NOTE — Telephone Encounter (Signed)
In lab sleep study has been denied by [place]. Will resubmit for HST.

## 2020-09-23 ENCOUNTER — Other Ambulatory Visit: Payer: Self-pay

## 2020-09-27 ENCOUNTER — Ambulatory Visit (HOSPITAL_BASED_OUTPATIENT_CLINIC_OR_DEPARTMENT_OTHER)
Admission: RE | Admit: 2020-09-27 | Discharge: 2020-09-27 | Disposition: A | Discharge status: Home / Self Care | Payer: Managed Care, Other (non HMO) | Source: Ambulatory Visit | Attending: Nurse Practitioner | Admitting: Nurse Practitioner

## 2020-09-27 ENCOUNTER — Encounter (HOSPITAL_COMMUNITY): Payer: Self-pay | Admitting: Nurse Practitioner

## 2020-09-27 ENCOUNTER — Ambulatory Visit (HOSPITAL_COMMUNITY)
Admission: RE | Admit: 2020-09-27 | Discharge: 2020-09-27 | Disposition: A | Discharge status: Home / Self Care | Payer: Managed Care, Other (non HMO) | Source: Ambulatory Visit | Attending: Nurse Practitioner | Admitting: Nurse Practitioner

## 2020-09-27 ENCOUNTER — Other Ambulatory Visit: Payer: Self-pay

## 2020-09-27 VITALS — BP 134/70 | HR 65 | Ht 68.0 in | Wt 149.2 lb

## 2020-09-27 DIAGNOSIS — I480 Paroxysmal atrial fibrillation: Secondary | ICD-10-CM

## 2020-09-27 DIAGNOSIS — I517 Cardiomegaly: Secondary | ICD-10-CM | POA: Diagnosis not present

## 2020-09-27 DIAGNOSIS — I4892 Unspecified atrial flutter: Secondary | ICD-10-CM

## 2020-09-27 DIAGNOSIS — D6869 Other thrombophilia: Secondary | ICD-10-CM

## 2020-09-27 DIAGNOSIS — Z79899 Other long term (current) drug therapy: Secondary | ICD-10-CM | POA: Diagnosis not present

## 2020-09-27 DIAGNOSIS — F17210 Nicotine dependence, cigarettes, uncomplicated: Secondary | ICD-10-CM | POA: Diagnosis not present

## 2020-09-27 LAB — ECHOCARDIOGRAM COMPLETE
AR max vel: 1.7 cm2
AV Area VTI: 1.64 cm2
AV Area mean vel: 1.55 cm2
AV Mean grad: 9 mmHg
AV Peak grad: 16.3 mmHg
Ao pk vel: 2.02 m/s
Area-P 1/2: 4.96 cm2
S' Lateral: 2.6 cm

## 2020-09-27 NOTE — Progress Notes (Signed)
Primary Care Physician: Soundra Pilon, FNP Referring Physician: ER f/u    Anthony Shields is a 60 y.o. male with a h/o of alcohol use, new onset afib, diagnosed with ER visit 08/10/20. He noted elevated HR in the 130's and presented to the ER. He received IV dilt in the ER and converted. Daily alcohol use, has not used for 36 hours. Tremors noted  in the ER. Received a dose of librium in the ER and sent home with short  Taper.   In the office today, he is in SR. Has not noted any further afib. He has cut back on alcohol. He also smokes cigarettes and mod caffeine use. Sleep apnea in the past but has not used CPAP in sometime. He was sent home on 30 mg Cardizem every 6 hours but has finished rx. Will make prn going forward.   F/u in afib clinic 09/27/20. He reports one questionable episode of elevated BP, not so sure re HR but he did take an 30 mg cardizem and felt better in 30 mins. He had an echo this am but not yet read. He had cut back on alcohol/caffeine/ contijues to smoke. Sleep study is pending 10/14/20.   Today, he denies symptoms of palpitations, chest pain, shortness of breath, orthopnea, PND, lower extremity edema, dizziness, presyncope, syncope, or neurologic sequela. The patient is tolerating medications without difficulties and is otherwise without complaint today.   No past medical history on file. No past surgical history on file.  Current Outpatient Medications  Medication Sig Dispense Refill   atorvastatin (LIPITOR) 10 MG tablet Take 20 mg by mouth daily.     diltiazem (CARDIZEM) 30 MG tablet Take 1 tablet every 4 hours AS NEEDED for AF heart rate >100 30 tablet 1   loratadine (CLARITIN) 10 MG tablet 1 tablet     Multiple Vitamins-Minerals (MULTIVITAMIN ADULTS 50+) TABS 1 tablet     PARoxetine (PAXIL) 10 MG tablet      No current facility-administered medications for this encounter.    Not on File  Social History   Socioeconomic History   Marital status: Married     Spouse name: Not on file   Number of children: Not on file   Years of education: Not on file   Highest education level: Not on file  Occupational History   Not on file  Tobacco Use   Smoking status: Every Day    Packs/day: 0.50    Years: 35.00    Pack years: 17.50    Types: Cigarettes   Smokeless tobacco: Never   Tobacco comments:    Half pack daily  Substance and Sexual Activity   Alcohol use: Yes    Alcohol/week: 22.0 standard drinks    Types: 21 Cans of beer, 1 Shots of liquor per week   Drug use: Yes    Frequency: 3.0 times per week    Types: Marijuana   Sexual activity: Not on file  Other Topics Concern   Not on file  Social History Narrative   Not on file   Social Determinants of Health   Financial Resource Strain: Not on file  Food Insecurity: Not on file  Transportation Needs: Not on file  Physical Activity: Not on file  Stress: Not on file  Social Connections: Not on file  Intimate Partner Violence: Not on file    No family history on file.  ROS- All systems are reviewed and negative except as per the HPI above  Physical  Exam: Vitals:   09/27/20 1000  BP: 134/70  Pulse: 65  Weight: 67.7 kg  Height: 5\' 8"  (1.727 m)   Wt Readings from Last 3 Encounters:  09/27/20 67.7 kg  08/18/20 67.9 kg  05/01/05 73.5 kg    Labs: Lab Results  Component Value Date   NA 140 08/10/2020   K 3.8 08/10/2020   CL 103 08/10/2020   CO2 22 08/10/2020   GLUCOSE 110 (H) 08/10/2020   BUN 17 08/10/2020   CREATININE 1.18 08/10/2020   CALCIUM 9.0 08/10/2020   MG 1.7 08/10/2020   No results found for: INR No results found for: CHOL, HDL, LDLCALC, TRIG   GEN- The patient is well appearing, alert and oriented x 3 today.   Head- normocephalic, atraumatic Eyes-  Sclera clear, conjunctiva pink Ears- hearing intact Oropharynx- clear Neck- supple, no JVP Lymph- no cervical lymphadenopathy Lungs- Clear to ausculation bilaterally, normal work of breathing Heart-  Regular rate and rhythm, no murmurs, rubs or gallops, PMI not laterally displaced GI- soft, NT, ND, + BS Extremities- no clubbing, cyanosis, or edema MS- no significant deformity or atrophy Skin- no rash or lesion Psych- euthymic mood, full affect Neuro- strength and sensation are intact  EKG-NSR at 65 bpm  pr int 126 ms, qrs int 92 ms, qtc 445 ms  Echo - pending results  EKG on arrival to ER showed afib with v rate of 174 bpm   Assessment and Plan: 1. New onset afib General eduction re afib    2. Lifestyle issues Encouraged no more than 2 alcoholic drinks a week  Smoking cessation encouraged Reduce caffeine intake  Sleep study pending  Regular exercise encouraged   E. CHA2DS2VASc score of 0  For now does not require daily anticoagulation  I will determine f/u once echo is reviewed   08/12/2020 C. Lupita Leash [facility] [address] [place], [place] [phone]

## 2020-09-27 NOTE — Progress Notes (Signed)
  Echocardiogram 2D Echocardiogram has been performed.  Anthony Shields F 09/27/2020, 10:07 AM

## 2020-09-28 ENCOUNTER — Other Ambulatory Visit (HOSPITAL_COMMUNITY): Payer: Self-pay | Admitting: *Deleted

## 2020-09-28 DIAGNOSIS — I480 Paroxysmal atrial fibrillation: Secondary | ICD-10-CM

## 2020-10-14 ENCOUNTER — Other Ambulatory Visit: Payer: Self-pay

## 2020-10-14 ENCOUNTER — Ambulatory Visit (HOSPITAL_BASED_OUTPATIENT_CLINIC_OR_DEPARTMENT_OTHER): Payer: Managed Care, Other (non HMO) | Attending: Nurse Practitioner | Admitting: Cardiovascular Disease

## 2020-10-14 DIAGNOSIS — I480 Paroxysmal atrial fibrillation: Secondary | ICD-10-CM | POA: Diagnosis not present

## 2020-10-14 DIAGNOSIS — Z79899 Other long term (current) drug therapy: Secondary | ICD-10-CM | POA: Diagnosis not present

## 2020-10-14 DIAGNOSIS — G4733 Obstructive sleep apnea (adult) (pediatric): Secondary | ICD-10-CM | POA: Insufficient documentation

## 2020-10-14 DIAGNOSIS — R0902 Hypoxemia: Secondary | ICD-10-CM | POA: Diagnosis not present

## 2020-10-21 ENCOUNTER — Encounter (HOSPITAL_BASED_OUTPATIENT_CLINIC_OR_DEPARTMENT_OTHER): Payer: Self-pay | Admitting: Cardiovascular Disease

## 2020-10-21 NOTE — Procedures (Signed)
      Patient Name: Anthony Shields, Anthony Shields Date: 10/15/2020 Gender: Male D.O.B: 12-10-1960 Age (years): 28 Referring Provider: Newman Nip Height (inches): 68 Interpreting Physician: Nicki Guadalajara MD, ABSM Weight (lbs): 143 RPSGT: Elaina Pattee BMI: 22 MRN: [id number] Neck Size: 14.50  CLINICAL INFORMATION Sleep Study Type: HST  Indication for sleep study: snoring, atrial fibrillation.  Epworth Sleepiness Score: 4  SLEEP STUDY TECHNIQUE A multi-channel overnight portable sleep study was performed. The channels recorded were: nasal airflow, thoracic respiratory movement, and oxygen saturation with a pulse oximetry. Snoring was also monitored.  MEDICATIONS atovastatin (LIPITOR) 10 MG tablet diltiazem (CARDIZEM) 30 MG tablet loratadine (CLARITIN) 10 MG tablet Multiple Vitamins-Minerals (MULTIVITAMIN ADULTS 50+) TABS PARoxetine (PAXIL) 10 MG tablet Patient self administered medications include: N/A.  SLEEP ARCHITECTURE Patient was studied for 490.5 minutes. The sleep efficiency was 100.0 % and the patient was supine for 37.5%. The arousal index was 0.0 per hour.  RESPIRATORY PARAMETERS The overall AHI was 34.4 per hour, with a central apnea index of 0 per hour. There was a severe positional component with supine sleep AHI 71/h vs non-supine sleep AHI 12.3/h.   The oxygen nadir was 76% during sleep. Time spent < 89% was 32.9 minutes.  CARDIAC DATA Mean heart rate during sleep was 75.1 bpm.  IMPRESSIONS - Severe obstructive sleep apnea occurred during this study (AHI 34.4/h). - Severe oxygen desaturation to a nadir of 76%. - Patient snored 0.4% during the sleep.  DIAGNOSIS - Obstructive Sleep Apnea (G47.33) - Nocturnal Hypoxemia (G47.36)  RECOMMENDATIONS - In this patient with cardiovascular comorbidities recommend an in-lab CPAP titration study. If unable to obatin an in-lab tiration initiate Auto-PAP with EPR of 3 at 7-20 cm of water. - Effort should be  made to optimize nasal and oropharyngeal patency.  - Positional therapy avoiding supine position during sleep. - Avoid alcohol, sedatives and other CNS depressants that may worsen sleep apnea and disrupt normal sleep architecture. - Sleep hygiene should be reviewed to assess factors that may improve sleep quality. - Weight management and regular exercise should be initiated or continued. - Recommend a download after 30 days and sleep clinic evaluation after one month of therapy.   [Electronically signed] 10/21/2020 09:18 AM  Nicki Guadalajara MD, [facility], ABSM Diplomate, American Board of Sleep Medicine   NPI: [id number] [facility] PH: [phone]   FX: [phone] ACCREDITED BY THE AMERICAN ACADEMY OF SLEEP MEDICINE

## 2020-10-24 ENCOUNTER — Telehealth: Payer: Self-pay | Admitting: *Deleted

## 2020-10-24 ENCOUNTER — Other Ambulatory Visit: Payer: Self-pay | Admitting: Cardiovascular Disease

## 2020-10-24 DIAGNOSIS — G4736 Sleep related hypoventilation in conditions classified elsewhere: Secondary | ICD-10-CM

## 2020-10-24 DIAGNOSIS — G4733 Obstructive sleep apnea (adult) (pediatric): Secondary | ICD-10-CM

## 2020-10-24 NOTE — Telephone Encounter (Signed)
-----   Message from Lennette Bihari, MD sent at 10/21/2020  9:23 AM EDT ----- Burna Mortimer, please notify pt and set up for CPAP titration or Auto-PAP

## 2020-10-24 NOTE — Telephone Encounter (Signed)
Patient notified of HST results and recommendations. He agrees to proceed with CPAP titration. He states that he has previously used CPAP about 15 years ago.

## 2020-11-21 ENCOUNTER — Telehealth: Payer: Self-pay | Admitting: *Deleted

## 2020-11-21 NOTE — Telephone Encounter (Signed)
-----   Message from Gaynelle Cage, CMA sent at 10/24/2020  9:28 AM EDT ----- CPAP titration

## 2020-11-21 NOTE — Telephone Encounter (Signed)
Prior Authorization for CPAP titration sent to [facility] via web portal. Request ID [id number].

## 2020-11-23 ENCOUNTER — Telehealth: Payer: Self-pay | Admitting: Cardiovascular Disease

## 2020-11-23 NOTE — Telephone Encounter (Signed)
New message:     Patient calling to get results for his sleep study.

## 2020-11-23 NOTE — Telephone Encounter (Signed)
Will route this message to our sleep study pool and coordinator, to further follow-up with the pt on requested sleep study results.

## 2020-11-25 NOTE — Telephone Encounter (Signed)
Patient notified Rosann Auerbach denied CPAP titration. APAP will be ordered from [facility]. Patient also made aware that it may take up to 6 months to get his machine due to the nationwide shortage of machines. He voiced his understanding and agrees to proceed.

## 2020-12-05 ENCOUNTER — Encounter: Payer: Self-pay | Admitting: Cardiovascular Disease

## 2020-12-05 NOTE — Progress Notes (Signed)
Cardiology Office Note:    Date:  12/06/2020   ID:  Hai, Grabe 17-Jun-1960, MRN [id number]  PCP:  Soundra Pilon, FNP   [facility] Providers Cardiologist:  New to [facility]am[facility] Donn  Click to update primary MD,subspecialty MD or APP then REFRESH:1}    Referring MD: Newman Nip, NP   Chief Complaint  Patient presents with   Atrial Fibrillation    Aug. 23, 2022   Anthony Shields is a 60 y.o. male with a hx of atrial fib. Hx of OSA . He has been seen in the atrial fibrillation clinic.  He was referred to general cardiology for follow-up visit and to establish with general cardiology.  Has PAF , OSA  His original presentation was to the emergency room.  He received IV diltiazem and converted back to sinus rhythm. Smokes 1/2 ppd  5-6 beers a day - is cutting back  Walks some , no real cardio exercise     History reviewed. No pertinent past medical history.  History reviewed. No pertinent surgical history.  Current Medications: Current Meds  Medication Sig   atorvastatin (LIPITOR) 10 MG tablet Take 20 mg by mouth daily.   diltiazem (CARDIZEM) 30 MG tablet Take 1 tablet every 4 hours AS NEEDED for AF heart rate >100   loratadine (CLARITIN) 10 MG tablet 1 tablet   Multiple Vitamins-Minerals (MULTIVITAMIN ADULTS 50+) TABS 1 tablet   nicotine (NICODERM CQ - DOSED IN MG/24 HOURS) 21 mg/24hr patch 21 mg daily.   PARoxetine (PAXIL) 10 MG tablet      Allergies:   Patient has no allergy information on record.   Social History   Socioeconomic History   Marital status: Married    Spouse name: Not on file   Number of children: Not on file   Years of education: Not on file   Highest education level: Not on file  Occupational History   Not on file  Tobacco Use   Smoking status: Every Day    Packs/day: 0.50    Years: 35.00    Pack years: 17.50    Types: Cigarettes   Smokeless tobacco: Never   Tobacco comments:    Half pack daily  Substance and Sexual Activity    Alcohol use: Yes    Alcohol/week: 22.0 standard drinks    Types: 21 Cans of beer, 1 Shots of liquor per week   Drug use: Yes    Frequency: 3.0 times per week    Types: Marijuana   Sexual activity: Not on file  Other Topics Concern   Not on file  Social History Narrative   Not on file   Social Determinants of Health   Financial Resource Strain: Not on file  Food Insecurity: Not on file  Transportation Needs: Not on file  Physical Activity: Not on file  Stress: Not on file  Social Connections: Not on file     Family History: The patient's family history is not on file.  ROS:   Please see the history of present illness.     All other systems reviewed and are negative.  EKGs/Labs/Other Studies Reviewed:    The following studies were reviewed today:    EKG:     Recent Labs: 08/10/2020: BUN 17; Creatinine, Ser 1.18; Hemoglobin 15.9; Magnesium 1.7; Platelets 209; Potassium 3.8; Sodium 140; TSH 0.802  Recent Lipid Panel No results found for: CHOL, TRIG, HDL, CHOLHDL, VLDL, LDLCALC, LDLDIRECT   Risk Assessment/Calculations:    CHA2DS2-VASc Score = 0  This indicates a 0.2% annual risk of stroke. The patient's score is based upon: CHF History: No HTN History: No Diabetes History: No Stroke History: No Vascular Disease History: No Age Score: 0 Gender Score: 0          Physical Exam:    VS:  BP (!) 148/80   Pulse 72   Ht 5\' 8"  (1.727 m)   Wt 153 lb (69.4 kg)   SpO2 98%   BMI 23.26 kg/m     Wt Readings from Last 3 Encounters:  12/06/20 153 lb (69.4 kg)  09/27/20 149 lb 3.2 oz (67.7 kg)  08/18/20 149 lb 9.6 oz (67.9 kg)     GEN:  Well nourished, well developed in no acute distress HEENT: Normal NECK: No JVD; No carotid bruits LYMPHATICS: No lymphadenopathy CARDIAC: RRR, no murmurs, rubs, gallops RESPIRATORY:  Clear to auscultation without rales, wheezing or rhonchi  ABDOMEN: Soft, non-tender, non-distended MUSCULOSKELETAL:  No edema; No deformity   SKIN: Warm and dry NEUROLOGIC:  Alert and oriented x 3 PSYCHIATRIC:  Normal affect   ASSESSMENT:    1. Left carotid bruit    PLAN:       Paroxysmal atrial fibrillation: He was seen in the emergency room with paroxysmal atrial fibrillation.  This resolved with IV Cardizem.  He now has oral Cardizem to take on an as-needed basis for further episodes of PAF. His CHADS2 Vas  score is 0 at this point.  He does have a left carotid bruit which we are investigating further.  His blood pressure is typically normal although it is a little elevated today which I suspect is due to whitecoat hypertension.  If he is found to have vascular disease and if his blood pressure remains normal then this would give him a CHADS2 Vas score of 2 which might necessitate starting him on a DOAC.  He will continue to work on reducing his alcohol intake, smoking cessation, starting an exercise program.  We will have him follow-up with an APP in 6 months.        Medication Adjustments/Labs and Tests Ordered: Current medicines are reviewed at length with the patient today.  Concerns regarding medicines are outlined above.  Orders Placed This Encounter  Procedures   VAS 10/18/20 CAROTID    No orders of the defined types were placed in this encounter.   Patient Instructions  Medication Instructions:  Your physician recommends that you continue on your current medications as directed. Please refer to the Current Medication list given to you today.  *If you need a refill on your cardiac medications before your next appointment, please call your pharmacy*   Lab Work: None Ordered If you have labs (blood work) drawn today and your tests are completely normal, you will receive your results only by: MyChart Message (if you have MyChart) OR A paper copy in the mail If you have any lab test that is abnormal or we need to change your treatment, we will call you to review the results.   Testing/Procedures: Your  physician has requested that you have a carotid duplex. This test is an ultrasound of the carotid arteries in your neck. It looks at blood flow through these arteries that supply the brain with blood. Allow one hour for this exam. There are no restrictions or special instructions.    Follow-Up: At [facility], you and your health needs are our priority.  As part of our continuing mission to provide you with exceptional heart care, we  have created designated Provider Care Teams.  These Care Teams include your primary Cardiologist (physician) and Advanced Practice Providers (APPs -  Physician Assistants and Nurse Practitioners) who all work together to provide you with the care you need, when you need it.    Your next appointment:   6 month(s)  The format for your next appointment:   In Person  Provider:   You may see Kristeen Miss, MD or one of the following Advanced Practice Providers on your designated Care Team:   Tereso Newcomer, PA-C Chelsea Aus, [place]    Signed, Kristeen Miss, MD  12/06/2020 11:02 AM    [facility]

## 2020-12-06 ENCOUNTER — Encounter: Payer: Self-pay | Admitting: Cardiovascular Disease

## 2020-12-06 ENCOUNTER — Other Ambulatory Visit: Payer: Self-pay

## 2020-12-06 ENCOUNTER — Ambulatory Visit: Payer: Managed Care, Other (non HMO) | Admitting: Cardiovascular Disease

## 2020-12-06 VITALS — BP 148/80 | HR 72 | Ht 68.0 in | Wt 153.0 lb

## 2020-12-06 DIAGNOSIS — R0989 Other specified symptoms and signs involving the circulatory and respiratory systems: Secondary | ICD-10-CM

## 2020-12-06 DIAGNOSIS — I480 Paroxysmal atrial fibrillation: Secondary | ICD-10-CM

## 2020-12-06 DIAGNOSIS — I4891 Unspecified atrial fibrillation: Secondary | ICD-10-CM | POA: Insufficient documentation

## 2020-12-06 NOTE — Patient Instructions (Signed)
Medication Instructions:  Your physician recommends that you continue on your current medications as directed. Please refer to the Current Medication list given to you today.  *If you need a refill on your cardiac medications before your next appointment, please call your pharmacy*   Lab Work: None Ordered If you have labs (blood work) drawn today and your tests are completely normal, you will receive your results only by: MyChart Message (if you have MyChart) OR A paper copy in the mail If you have any lab test that is abnormal or we need to change your treatment, we will call you to review the results.   Testing/Procedures: Your physician has requested that you have a carotid duplex. This test is an ultrasound of the carotid arteries in your neck. It looks at blood flow through these arteries that supply the brain with blood. Allow one hour for this exam. There are no restrictions or special instructions.    Follow-Up: At [facility], you and your health needs are our priority.  As part of our continuing mission to provide you with exceptional heart care, we have created designated Provider Care Teams.  These Care Teams include your primary Cardiologist (physician) and Advanced Practice Providers (APPs -  Physician Assistants and Nurse Practitioners) who all work together to provide you with the care you need, when you need it.    Your next appointment:   6 month(s)  The format for your next appointment:   In Person  Provider:   You may see Kristeen Miss, MD or one of the following Advanced Practice Providers on your designated Care Team:   Tereso Newcomer, PA-C Vin Hartman, [place]

## 2020-12-09 ENCOUNTER — Ambulatory Visit (HOSPITAL_COMMUNITY)
Admission: RE | Admit: 2020-12-09 | Payer: Managed Care, Other (non HMO) | Source: Ambulatory Visit | Attending: Cardiovascular Disease | Admitting: Cardiovascular Disease

## 2020-12-14 ENCOUNTER — Other Ambulatory Visit: Payer: Self-pay

## 2020-12-14 ENCOUNTER — Ambulatory Visit (HOSPITAL_COMMUNITY)
Admission: RE | Admit: 2020-12-14 | Discharge: 2020-12-14 | Disposition: A | Discharge status: Home / Self Care | Payer: Managed Care, Other (non HMO) | Source: Ambulatory Visit | Attending: Cardiovascular Disease | Admitting: Cardiovascular Disease

## 2020-12-14 DIAGNOSIS — R0989 Other specified symptoms and signs involving the circulatory and respiratory systems: Secondary | ICD-10-CM | POA: Insufficient documentation

## 2021-03-23 ENCOUNTER — Telehealth: Payer: Self-pay | Admitting: Cardiovascular Disease

## 2021-03-23 NOTE — Telephone Encounter (Signed)
Follow Up:     Patient would like to know the Status of his C Pap machine?

## 2021-03-24 NOTE — Telephone Encounter (Signed)
Returned a call to the patient and provided contact information for [facility]. Reminded the patient again that it could take up to 6 months to receive a CPAP machine. His request was submitted in August. He can call [facility] and ask for [facility]. She should be able to tell him where he is on the list.

## 2021-05-08 ENCOUNTER — Other Ambulatory Visit (HOSPITAL_COMMUNITY): Payer: Self-pay | Admitting: Nurse Practitioner

## 2021-05-09 ENCOUNTER — Other Ambulatory Visit (HOSPITAL_COMMUNITY): Payer: Self-pay | Admitting: *Deleted

## 2021-05-09 MED ORDER — DILTIAZEM HCL 30 MG PO TABS: ORAL_TABLET | ORAL | 1 refills | Status: DC

## 2021-06-11 NOTE — Progress Notes (Signed)
Cardiology Office Note:    Date:  06/12/2021   ID:  Tremaine, Dworaczyk 1961-04-04, MRN [id number]  PCP:  Kristen Loader, FNP   [facility] Providers Cardiologist:  Shannara Winbush    Referring MD: Kristen Loader, FNP   Chief Complaint  Patient presents with   carotid artery disease    Sleep Apnea    Aug. 23, 2022   Anthony Shields is a 61 y.o. male with a hx of atrial fib. Hx of OSA . He has been seen in the atrial fibrillation clinic.  He was referred to general cardiology for follow-up visit and to establish with general cardiology.  Has PAF , OSA  His original presentation was to the emergency room.  He received IV diltiazem and converted back to sinus rhythm. Smokes 1/2 ppd  5-6 beers a day - is cutting back  Walks some , no real cardio exercise   Feb. 27, 2023  Anthony Shields is seen for follow up of his Atrial fib, OSA Smokes 1/2 ppd  BP has been elevated.  BP is up and down.   Has cut back on salty foods  Has OSA,  still does not have a CPAP machine  - sees Claiborne Billings for OSA  Took a Diltiazem 30 mg tablet today    No past medical history on file.  No past surgical history on file.  Current Medications: Current Meds  Medication Sig   atorvastatin (LIPITOR) 20 MG tablet Take 20 mg by mouth daily. Take 10mg  daily   cetirizine (ZYRTEC) 10 MG tablet daily as needed.   diltiazem (CARDIZEM CD) 180 MG 24 hr capsule Take 1 capsule (180 mg total) by mouth daily.   diltiazem (CARDIZEM) 30 MG tablet Take 1 tablet every 4 hours AS NEEDED for AF heart rate >100   loratadine (CLARITIN) 10 MG tablet 1 tablet   Multiple Vitamins-Minerals (MULTIVITAMIN ADULTS 50+) TABS 1 tablet   PARoxetine (PAXIL) 10 MG tablet      Allergies:   Patient has no allergy information on record.   Social History   Socioeconomic History   Marital status: Married    Spouse name: Not on file   Number of children: Not on file   Years of education: Not on file   Highest education level: Not on file   Occupational History   Not on file  Tobacco Use   Smoking status: Every Day    Packs/day: 0.50    Years: 35.00    Pack years: 17.50    Types: Cigarettes   Smokeless tobacco: Never   Tobacco comments:    Half pack daily  Substance and Sexual Activity   Alcohol use: Yes    Alcohol/week: 22.0 standard drinks    Types: 21 Cans of beer, 1 Shots of liquor per week   Drug use: Yes    Frequency: 3.0 times per week    Types: Marijuana   Sexual activity: Not on file  Other Topics Concern   Not on file  Social History Narrative   Not on file   Social Determinants of Health   Financial Resource Strain: Not on file  Food Insecurity: Not on file  Transportation Needs: Not on file  Physical Activity: Not on file  Stress: Not on file  Social Connections: Not on file     Family History: The patient's family history is not on file.  ROS:   Please see the history of present illness.     All other systems  reviewed and are negative.  EKGs/Labs/Other Studies Reviewed:    The following studies were reviewed today:    EKG:     Recent Labs: 08/10/2020: BUN 17; Creatinine, Ser 1.18; Hemoglobin 15.9; Magnesium 1.7; Platelets 209; Potassium 3.8; Sodium 140; TSH 0.802  Recent Lipid Panel No results found for: CHOL, TRIG, HDL, CHOLHDL, VLDL, LDLCALC, LDLDIRECT   Risk Assessment/Calculations:    CHA2DS2-VASc Score = 0  This indicates a 0.2% annual risk of stroke. The patient's score is based upon: CHF History: 0 HTN History: 0 Diabetes History: 0 Stroke History: 0 Vascular Disease History: 0 Age Score: 0 Gender Score: 0           Physical Exam:    Physical Exam: Blood pressure (!) 158/88, pulse 91, height 5\' 8"  (1.727 m), weight 154 lb 12.8 oz (70.2 kg), SpO2 97 %.  GEN:  Well nourished, well developed in no acute distress HEENT: Normal NECK: No JVD; No carotid bruits LYMPHATICS: No lymphadenopathy CARDIAC: RRR , no murmurs, rubs, gallops RESPIRATORY:  Clear to  auscultation without rales, wheezing or rhonchi  ABDOMEN: Soft, non-tender, non-distended MUSCULOSKELETAL:  No edema; No deformity  SKIN: Warm and dry NEUROLOGIC:  Alert and oriented x 3   ASSESSMENT:    No diagnosis found.  PLAN:       Paroxysmal atrial fibrillation:   no recurrent Afib .  Has Dilt 30 mg tabs to take - has been taking them for HTN more than PAF .    2.  HTN:   BP has been elevated.   Still smoking.   Still has uncontrolled OSA - waiting on CPAP machine.   Supply chain issues   Add Diltiazem 180 CD   Follow up with Dr. Claiborne Billings,   Follow up with APP in 6 months     Medication Adjustments/Labs and Tests Ordered: Current medicines are reviewed at length with the patient today.  Concerns regarding medicines are outlined above.  No orders of the defined types were placed in this encounter.   Meds ordered this encounter  Medications   diltiazem (CARDIZEM CD) 180 MG 24 hr capsule    Sig: Take 1 capsule (180 mg total) by mouth daily.    Dispense:  90 capsule    Refill:  3      Patient Instructions  Medication Instructions:  Please start Cardizem CD 180 a day. Continue all other medications as listed.  *If you need a refill on your cardiac medications before your next appointment, please call your pharmacy*  Follow-Up: At [facility], you and your health needs are our priority.  As part of our continuing mission to provide you with exceptional heart care, we have created designated Provider Care Teams.  These Care Teams include your primary Cardiologist (physician) and Advanced Practice Providers (APPs -  Physician Assistants and Nurse Practitioners) who all work together to provide you with the care you need, when you need it.  We recommend signing up for the patient portal called "MyChart".  Sign up information is provided on this After Visit Summary.  MyChart is used to connect with patients for Virtual Visits (Telemedicine).  Patients are able to view  lab/test results, encounter notes, upcoming appointments, etc.  Non-urgent messages can be sent to your provider as well.   To learn more about what you can do with MyChart, go to [email or web address].    Your next appointment:   6 month(s)  The format for your next appointment:   In Person  Provider:  Nicholes Rough, PA-C, Dayna Dunn, PA-C, Ermalinda Barrios, PA-C, Christen Bame, NP, or Richardson Dopp, PA-C     Then, Mertie Moores, MD will plan to see you again in 1 year(s).{  Thank you for choosing [facility]!!      Signed, Mertie Moores, MD  06/12/2021 5:12 PM    [place]

## 2021-06-12 ENCOUNTER — Other Ambulatory Visit: Payer: Self-pay

## 2021-06-12 ENCOUNTER — Ambulatory Visit: Payer: Managed Care, Other (non HMO) | Admitting: Cardiovascular Disease

## 2021-06-12 ENCOUNTER — Encounter: Payer: Self-pay | Admitting: Cardiovascular Disease

## 2021-06-12 VITALS — BP 158/88 | HR 91 | Ht 68.0 in | Wt 154.8 lb

## 2021-06-12 DIAGNOSIS — I480 Paroxysmal atrial fibrillation: Secondary | ICD-10-CM

## 2021-06-12 DIAGNOSIS — I6523 Occlusion and stenosis of bilateral carotid arteries: Secondary | ICD-10-CM

## 2021-06-12 DIAGNOSIS — I779 Disorder of arteries and arterioles, unspecified: Secondary | ICD-10-CM | POA: Insufficient documentation

## 2021-06-12 MED ORDER — DILTIAZEM HCL ER COATED BEADS 180 MG PO CP24: 180.0000 mg | ORAL_CAPSULE | Freq: Every day | ORAL | 3 refills | Status: DC

## 2021-06-12 NOTE — Patient Instructions (Addendum)
Medication Instructions:  Please start Cardizem CD 180 a day. Continue all other medications as listed.  *If you need a refill on your cardiac medications before your next appointment, please call your pharmacy*  Follow-Up: At [facility], you and your health needs are our priority.  As part of our continuing mission to provide you with exceptional heart care, we have created designated Provider Care Teams.  These Care Teams include your primary Cardiologist (physician) and Advanced Practice Providers (APPs -  Physician Assistants and Nurse Practitioners) who all work together to provide you with the care you need, when you need it.  We recommend signing up for the patient portal called "MyChart".  Sign up information is provided on this After Visit Summary.  MyChart is used to connect with patients for Virtual Visits (Telemedicine).  Patients are able to view lab/test results, encounter notes, upcoming appointments, etc.  Non-urgent messages can be sent to your provider as well.   To learn more about what you can do with MyChart, go to [email or web address].    Your next appointment:   6 month(s)  The format for your next appointment:   In Person  Provider:   Nicholes Rough, PA-C, Melina Copa, PA-C, Ermalinda Barrios, PA-C, Christen Bame, NP, or Richardson Dopp, PA-C     Then, Mertie Moores, MD will plan to see you again in 1 year(s).{  Thank you for choosing [facility]!!

## 2021-06-16 ENCOUNTER — Other Ambulatory Visit (HOSPITAL_COMMUNITY): Payer: Self-pay | Admitting: Nurse Practitioner

## 2021-08-29 ENCOUNTER — Other Ambulatory Visit (HOSPITAL_COMMUNITY): Payer: Self-pay | Admitting: Nurse Practitioner

## 2021-11-09 DIAGNOSIS — E785 Hyperlipidemia, unspecified: Secondary | ICD-10-CM | POA: Diagnosis not present

## 2021-11-09 DIAGNOSIS — I480 Paroxysmal atrial fibrillation: Secondary | ICD-10-CM | POA: Diagnosis not present

## 2021-11-09 DIAGNOSIS — Z0000 Encounter for general adult medical examination without abnormal findings: Secondary | ICD-10-CM | POA: Diagnosis not present

## 2021-11-09 DIAGNOSIS — Z125 Encounter for screening for malignant neoplasm of prostate: Secondary | ICD-10-CM | POA: Diagnosis not present

## 2021-11-09 DIAGNOSIS — F17200 Nicotine dependence, unspecified, uncomplicated: Secondary | ICD-10-CM | POA: Diagnosis not present

## 2021-11-09 DIAGNOSIS — F524 Premature ejaculation: Secondary | ICD-10-CM | POA: Diagnosis not present

## 2021-11-09 DIAGNOSIS — J410 Simple chronic bronchitis: Secondary | ICD-10-CM | POA: Diagnosis not present

## 2021-11-27 NOTE — Progress Notes (Unsigned)
Cardiology Office Note    Date:  11/28/2021   ID:  Mazin, Emma [birth year], MRN [id number]   PCP:  Soundra Pilon, FNP   [facility]  Cardiologist:  Kristeen Miss, MD   Advanced Practice Provider:  No care team member to display Electrophysiologist:  None   [id number]   Chief Complaint  Patient presents with   Follow-up    History of Present Illness:  Anthony Shields is a 61 y.o. male with history of PAF, HTN, OSA, tobacco.  Patient last saw Dr. Elease Hashimoto 05/2021 and BP up. Diltiazem added. He was also waiting on CPAP machine.  Patient comes in for f/u. BP still running high here and at home. Is using CPAP but it leaks because he moves all around. Smokes 1/2 ppd. Hasn't felt his heart skip, chest pain, dyspnea, edema. Stays active around the house and helps take care of his grand kids.     History reviewed. No pertinent past medical history.  History reviewed. No pertinent surgical history.  Current Medications: Current Meds  Medication Sig   atorvastatin (LIPITOR) 20 MG tablet Take 20 mg by mouth daily. Take  daily   cetirizine (ZYRTEC) 10 MG tablet daily as needed.   diltiazem (CARDIZEM CD) 180 MG 24 hr capsule Take 1 capsule (180 mg total) by mouth daily.   diltiazem (CARDIZEM) 30 MG tablet TAKE 1 TABLET BY MOUTH EVERY 4 HOURS AS NEEDED FOR HEART RATE >100   loratadine (CLARITIN) 10 MG tablet 1 tablet   losartan (COZAAR) 25 MG tablet Take 1 tablet (25 mg total) by mouth daily.   Multiple Vitamins-Minerals (MULTIVITAMIN ADULTS 50+) TABS 1 tablet   PARoxetine (PAXIL) 10 MG tablet Per patient taking 20 mg daily     Allergies:   Patient has no allergy information on record.   Social History   Socioeconomic History   Marital status: Married    Spouse name: Not on file   Number of children: Not on file   Years of education: Not on file   Highest education level: Not on file  Occupational History   Not on file  Tobacco Use    Smoking status: Every Day    Packs/day: 0.50    Years: 35.00    Total pack years: 17.50    Types: Cigarettes   Smokeless tobacco: Never   Tobacco comments:    Half pack daily  Substance and Sexual Activity   Alcohol use: Yes    Alcohol/week: 22.0 standard drinks of alcohol    Types: 21 Cans of beer, 1 Shots of liquor per week   Drug use: Yes    Frequency: 3.0 times per week    Types: Marijuana   Sexual activity: Not on file  Other Topics Concern   Not on file  Social History Narrative   Not on file   Social Determinants of Health   Financial Resource Strain: Not on file  Food Insecurity: Not on file  Transportation Needs: Not on file  Physical Activity: Not on file  Stress: Not on file  Social Connections: Not on file     Family History:  The patient's  family history is not on file.   ROS:   Please see the history of present illness.    ROS All other systems reviewed and are negative.   PHYSICAL EXAM:   VS:  BP (!) 140/90   Pulse 70   Ht 5' 8.5" (1.74 m)   Wt  157 lb 6.4 oz (71.4 kg)   SpO2 96%   BMI 23.58 kg/m   Physical Exam  GEN: Well nourished, well developed, in no acute distress  Neck: bilateral carotid dopplers,no JVD,or masses Cardiac:RRR; 1/6 systolic murmur LSB Respiratory: decreased breath sounds but  clear to auscultation bilaterally, normal work of breathing GI: soft, nontender, nondistended, + BS Ext: without cyanosis, clubbing, or edema, Good distal pulses bilaterally Neuro:  Alert and Oriented x 3 Psych: euthymic mood, full affect  Wt Readings from Last 3 Encounters:  11/28/21 157 lb 6.4 oz (71.4 kg)  06/12/21 154 lb 12.8 oz (70.2 kg)  12/06/20 153 lb (69.4 kg)      Studies/Labs Reviewed:   EKG:  EKG is  ordered today.  The ekg ordered today demonstrates NSR IRBBB  Recent Labs: No results found for requested labs within last 365 days.   Lipid Panel No results found for: "CHOL", "TRIG", "HDL", "CHOLHDL", "VLDL", "LDLCALC",  "LDLDIRECT"  Additional studies/ records that were reviewed today include:  Carotid dopplers 11/2020  Summary:  Right Carotid: Velocities in the right ICA are consistent with a 1-39%  stenosis.                Non-hemodynamically significant plaque <50% noted in the  CCA.   Left Carotid: Velocities in the left ICA are consistent with a 1-39%  stenosis.               Non-hemodynamically significant plaque <50% noted in the  CCA.   Vertebrals: Bilateral vertebral arteries demonstrate antegrade flow.  Subclavians: Normal flow hemodynamics were seen in bilateral subclavian               arteries.    Risk Assessment/Calculations:    CHA2DS2-VASc Score = 0   This indicates a 0.2% annual risk of stroke. The patient's score is based upon: CHF History: 0 HTN History: 0 Diabetes History: 0 Stroke History: 0 Vascular Disease History: 0 Age Score: 0 Gender Score: 0         ASSESSMENT:    1. Paroxysmal atrial fibrillation (HCC)   2. Essential hypertension   3. Obstructive sleep apnea (adult) (pediatric)   4. Bilateral carotid artery stenosis   5. Other hyperlipidemia   6. Tobacco abuse      PLAN:  In order of problems listed above:  PAF on diltiazem CHADS2VAsc 0 so no anticoag. No recent symptoms  HTN BP running high here and at home. Add losartan. Repeat bmet in 2 weeks. Crt on [facility] 11/09/21 0.95. 150 min exercise weekly  OSA on CPAP but mask comes off a lot. F/u with Dr. Tresa Endo  Left carotid bruit-mild 1-39% on dopplers 11/2020  HLD on lipitor LDL 81   Tobacco abuse-smoking cessation discussed.  Shared Decision Making/Informed Consent        Medication Adjustments/Labs and Tests Ordered: Current medicines are reviewed at length with the patient today.  Concerns regarding medicines are outlined above.  Medication changes, Labs and Tests ordered today are listed in the Patient Instructions below. Patient Instructions  Medication Instructions:  Your physician has  recommended you make the following change in your medication:  START: LOSARTAN 25 MG DAILY   *If you need a refill on your cardiac medications before your next appointment, please call your pharmacy*   Lab Work: TO BE DONE IN 2 WEEKS: BMET If you have labs (blood work) drawn today and your tests are completely normal, you will receive your results only by: [employer] (  if you have MyChart) OR A paper copy in the mail If you have any lab test that is abnormal or we need to change your treatment, we will call you to review the results.   Testing/Procedures: NONE   Follow-Up: At [facility], you and your health needs are our priority.  As part of our continuing mission to provide you with exceptional heart care, we have created designated Provider Care Teams.  These Care Teams include your primary Cardiologist (physician) and Advanced Practice Providers (APPs -  Physician Assistants and Nurse Practitioners) who all work together to provide you with the care you need, when you need it.  We recommend signing up for the patient portal called "MyChart".  Sign up information is provided on this After Visit Summary.  MyChart is used to connect with patients for Virtual Visits (Telemedicine).  Patients are able to view lab/test results, encounter notes, upcoming appointments, etc.  Non-urgent messages can be sent to your provider as well.   To learn more about what you can do with MyChart, go to [email or web address].    Your next appointment:   NEXT AVAILABLE   The format for your next appointment:   In Person  Provider:   DR. Tresa Endo   Your next appointment:   1 YEAR  The format for your next appointment:   In Person  Provider:   DR. Elease Hashimoto  OTHER:  YOUR PROVIDER RECOMMENDS THAT EXERCISE 150 MINUTES PER WEEK.    Two Gram Sodium Diet 2000 mg  What is Sodium? Sodium is a mineral found naturally in many foods. The most significant source of sodium in the diet is table  salt, which is about 40% sodium.  Processed, convenience, and preserved foods also contain a large amount of sodium.  The body needs only 500 mg of sodium daily to function,  A normal diet provides more than enough sodium even if you do not use salt.  Why Limit Sodium? A build up of sodium in the body can cause thirst, increased blood pressure, shortness of breath, and water retention.  Decreasing sodium in the diet can reduce edema and risk of heart attack or stroke associated with high blood pressure.  Keep in mind that there are many other factors involved in these health problems.  Heredity, obesity, lack of exercise, cigarette smoking, stress and what you eat all play a role.  General Guidelines: Do not add salt at the table or in cooking.  One teaspoon of salt contains over 2 grams of sodium. Read food labels Avoid processed and convenience foods Ask your dietitian before eating any foods not dicussed in the menu planning guidelines Consult your physician if you wish to use a salt substitute or a sodium containing medication such as antacids.  Limit milk and milk products to 16 oz (2 cups) per day.  Shopping Hints: READ LABELS!! "Dietetic" does not necessarily mean low sodium. Salt and other sodium ingredients are often added to foods during processing.    Menu Planning Guidelines Food Group Choose More Often Avoid  Beverages (see also the milk group All fruit juices, low-sodium, salt-free vegetables juices, low-sodium carbonated beverages Regular vegetable or tomato juices, commercially softened water used for drinking or cooking  Breads and Cereals Enriched white, wheat, rye and pumpernickel bread, hard rolls and dinner rolls; muffins, cornbread and waffles; most dry cereals, cooked cereal without added salt; unsalted crackers and breadsticks; low sodium or homemade bread crumbs Bread, rolls and crackers with salted tops; quick breads; instant  hot cereals; pancakes; commercial bread  stuffing; self-rising flower and biscuit mixes; regular bread crumbs or cracker crumbs  Desserts and Sweets Desserts and sweets mad with mild should be within allowance Instant pudding mixes and cake mixes  Fats Butter or margarine; vegetable oils; unsalted salad dressings, regular salad dressings limited to 1 Tbs; light, sour and heavy cream Regular salad dressings containing bacon fat, bacon bits, and salt pork; snack dips made with instant soup mixes or processed cheese; salted nuts  Fruits Most fresh, frozen and canned fruits Fruits processed with salt or sodium-containing ingredient (some dried fruits are processed with sodium sulfites        Vegetables Fresh, frozen vegetables and low- sodium canned vegetables Regular canned vegetables, sauerkraut, pickled vegetables, and others prepared in brine; frozen vegetables in sauces; vegetables seasoned with ham, bacon or salt pork  Condiments, Sauces, Miscellaneous  Salt substitute with physician's approval; pepper, herbs, spices; vinegar, lemon or lime juice; hot pepper sauce; garlic powder, onion powder, low sodium soy sauce (1 Tbs.); low sodium condiments (ketchup, chili sauce, mustard) in limited amounts (1 tsp.) fresh ground horseradish; unsalted tortilla chips, pretzels, potato chips, popcorn, salsa (1/4 cup) Any seasoning made with salt including garlic salt, celery salt, onion salt, and seasoned salt; sea salt, rock salt, kosher salt; meat tenderizers; monosodium glutamate; mustard, regular soy sauce, barbecue, sauce, chili sauce, teriyaki sauce, steak sauce, Worcestershire sauce, and most flavored vinegars; canned gravy and mixes; regular condiments; salted snack foods, olives, picles, relish, horseradish sauce, catsup   Food preparation: Try these seasonings Meats:    Pork Sage, onion Serve with applesauce  Chicken Poultry seasoning, thyme, parsley Serve with cranberry sauce  Lamb Curry powder, rosemary, garlic, thyme Serve with mint sauce  or jelly  Veal Marjoram, basil Serve with current jelly, cranberry sauce  Beef Pepper, bay leaf Serve with dry mustard, unsalted chive butter  Fish [place] leaf, dill Serve with unsalted lemon butter, unsalted parsley butter  Vegetables:    Asparagus Lemon juice   Broccoli Lemon juice   Carrots Mustard dressing parsley, mint, nutmeg, glazed with unsalted butter and sugar   Green beans Marjoram, lemon juice, nutmeg,dill seed   Tomatoes Basil, marjoram, onion   Spice /blend for [employer]" 4 tsp ground thyme 1 tsp ground sage 3 tsp ground rosemary 4 tsp ground marjoram   Test your knowledge A product that says "Salt Free" may still contain sodium. True or False Garlic Powder and Hot Pepper Sauce an be used as alternative seasonings.True or False Processed foods have more sodium than fresh foods.  True or False Canned Vegetables have less sodium than froze True or False   WAYS TO DECREASE YOUR SODIUM INTAKE Avoid the use of added salt in cooking and at the table.  Table salt (and other prepared seasonings which contain salt) is probably one of the greatest sources of sodium in the diet.  Unsalted foods can gain flavor from the sweet, sour, and butter taste sensations of herbs and spices.  Instead of using salt for seasoning, try the following seasonings with the foods listed.  Remember: how you use them to enhance natural food flavors is limited only by your creativity... Allspice-Meat, fish, eggs, fruit, peas, red and yellow vegetables Almond Extract-Fruit baked goods Anise Seed-Sweet breads, fruit, carrots, beets, cottage cheese, cookies (tastes like licorice) Basil-Meat, fish, eggs, vegetables, rice, vegetables salads, soups, sauces Bay Leaf-Meat, fish, stews, poultry Burnet-Salad, vegetables (cucumber-like flavor) Caraway Seed-Bread, cookies, cottage cheese, meat, vegetables, cheese, rice Cardamon-Baked goods,  fruit, soups Celery Powder or seed-Salads, salad dressings, sauces, meatloaf,  soup, bread.Do not use  celery salt Chervil-Meats, salads, fish, eggs, vegetables, cottage cheese (parsley-like flavor) Chili Power-Meatloaf, chicken cheese, corn, eggplant, egg dishes Chives-Salads cottage cheese, egg dishes, soups, vegetables, sauces Cilantro-Salsa, casseroles Cinnamon-Baked goods, fruit, pork, lamb, chicken, carrots Cloves-Fruit, baked goods, fish, pot roast, green beans, beets, carrots Coriander-Pastry, cookies, meat, salads, cheese (lemon-orange flavor) Cumin-Meatloaf, fish,cheese, eggs, cabbage,fruit pie (caraway flavor) [employer]Curry Powder-Meat, fruit, eggs, fish, poultry, cottage cheese, vegetables Dill Seed-Meat, cottage cheese, poultry, vegetables, fish, salads, bread Fennel Seed-Bread, cookies, apples, pork, eggs, fish, beets, cabbage, cheese, Licorice-like flavor Garlic-(buds or powder) Salads, meat, poultry, fish, bread, butter, vegetables, potatoes.Do not  use garlic salt Ginger-Fruit, vegetables, baked goods, meat, fish, poultry Horseradish Root-Meet, vegetables, butter Lemon Juice or Extract-Vegetables, fruit, tea, baked goods, fish salads Mace-Baked goods fruit, vegetables, fish, poultry (taste like nutmeg) Maple Extract-Syrups Marjoram-Meat, chicken, fish, vegetables, breads, green salads (taste like Sage) Mint-Tea, lamb, sherbet, vegetables, desserts, carrots, cabbage Mustard, Dry or Seed-Cheese, eggs, meats, vegetables, poultry Nutmeg-Baked goods, fruit, chicken, eggs, vegetables, desserts Onion Powder-Meat, fish, poultry, vegetables, cheese, eggs, bread, rice salads (Do not use   Onion salt) Orange Extract-Desserts, baked goods Oregano-Pasta, eggs, cheese, onions, pork, lamb, fish, chicken, vegetables, green salads Paprika-Meat, fish, poultry, eggs, cheese, vegetables Parsley Flakes-Butter, vegetables, meat fish, poultry, eggs, bread, salads (certain forms may   Contain sodium Pepper-Meat fish, poultry, vegetables, eggs Peppermint Extract-Desserts, baked  goods Poppy Seed-Eggs, bread, cheese, fruit dressings, baked goods, noodles, vegetables, cottage  [employer] RxCheese Poultry Seasoning-Poultry,veal Rosemary-Lamb, poultry, meat, fish, cauliflower, turnips,eggs bread Saffron-Rice, bread, veal, chicken, fish, eggs Sage-Meat, fish, poultry, onions, eggplant, tomateos, pork, stews Savory-Eggs, salads, poultry, meat, rice, vegetables, soups, pork Tarragon-Meat, poultry, fish, eggs, butter, vegetables (licorice-like flavor)  Thyme-Meat, poultry, fish, eggs, vegetables, (clover-like flavor), sauces, soups Tumeric-Salads, butter, eggs, fish, rice, vegetables (saffron-like flavor) Vanilla Extract-Baked goods, candy Vinegar-Salads, vegetables, meat marinades Walnut Extract-baked goods, candy   2. Choose your Foods Wisely   The following is a list of foods to avoid which are high in sodium:  Meats-Avoid all smoked, canned, salt cured, dried and kosher meat and fish as well as Anchovies   Lox F[employer] SemiconductorBacon    Luncheon meats:Bologna, Liverwurst, Pastrami Canned meat or fish  Marinated herring Caviar    Pepperoni Corned Beef   Pizza Dried chipped beef  Salami Frozen breaded fish or meat Salt pork Frankfurters or hot dogs  Sardines Gefilte fish   Sausage Ham (boiled ham, Proscuitto Smoked butt    spiced ham)   Spam      TV Dinners Vegetables Canned vegetables (Regular) Relish Canned mushrooms  Sauerkraut Olives    Tomato juice Pickles  Bakery and Dessert Products Canned puddings  Cream pies Cheesecake   Decorated cakes Cookies  Beverages/Juices Tomato juice, regular  Gatorade   V-8 vegetable juice, regular  Breads and Cereals Biscuit mixes   Salted potato chips, corn chips, pretzels Bread stuffing mixes  Salted crackers and rolls Pancake and waffle mixes Self-rising flour  Seasonings Accent    Meat sauces Barbecue sauce  Meat tenderizer Catsup    Monosodium glutamate (MSG) Celery salt   Onion salt Chili sauce   Prepared mustard Garlic  salt   Salt, seasoned salt, sea salt Gravy mixes   Soy sauce Horseradish   Steak sauce Ketchup   Tartar sauce Lite salt    Teriyaki sauce Marinade mixes   Worcestershire sauce  Others Baking powder   Cocoa and cocoa mixes Baking soda  Commercial casserole mixes Candy-caramels, chocolate  Dehydrated soups    Bars, fudge,nougats  Instant rice and pasta mixes Canned broth or soup  Maraschino cherries Cheese, aged and processed cheese and cheese spreads  Learning Assessment Quiz  Indicated T (for True) or F (for False) for each of the following statements:  _____ Fresh fruits and vegetables and unprocessed grains are generally low in sodium _____ Water may contain a considerable amount of sodium, depending on the source _____ You can always tell if a food is high in sodium by tasting it _____ Certain laxatives my be high in sodium and should be avoided unless prescribed   by a physician or pharmacist _____ Salt substitutes may be used freely by anyone on a sodium restricted diet _____ Sodium is present in table salt, food additives and as a natural component of   most foods _____ Table salt is approximately 90% sodium _____ Limiting sodium intake may help prevent excess fluid accumulation in the body _____ On a sodium-restricted diet, seasonings such as bouillon soy sauce, and    cooking wine should be used in place of table salt _____ On an ingredient list, a product which lists monosodium glutamate as the first   ingredient is an appropriate food to include on a low sodium diet  Circle the best answer(s) to the following statements (Hint: there may be more than one correct answer)  11. On a low-sodium diet, some acceptable snack items are:    A. Olives  F. Bean dip   K. Grapefruit juice    B. Salted Pretzels G. Commercial Popcorn   L. Canned peaches    C. Carrot Sticks  H. Bouillon   M. Unsalted nuts   D. [place] fries  I. Peanut butter crackers N. Salami   E. Sweet pickles J.  Tomato Juice   O. Pizza  12.  Seasonings that may be used freely on a reduced - sodium diet include   A. Lemon wedges F.Monosodium glutamate K. Celery seed    B.Soysauce   G. Pepper   L. Mustard powder   C. Sea salt  H. Cooking wine  M. Onion flakes   D. Vinegar  E. Prepared horseradish N. Salsa   E. Sage   J. Worcestershire sauce  O. Chutney  Smoking Tobacco Information, Adult Smoking tobacco can be harmful to your health. Tobacco contains a toxic colorless chemical called nicotine. Nicotine causes changes in your brain that make you want more and more. This is called addiction. This can make it hard to stop smoking once you start. Tobacco also has other toxic chemicals that can hurt your body and raise your risk of many cancers. Menthol or "lite" tobacco or cigarette brands are not safer than regular brands. How can smoking tobacco affect me? Smoking tobacco puts you at risk for: Cancer. Smoking is most commonly associated with lung cancer, but can also lead to cancer in other parts of the body. Chronic obstructive pulmonary disease (COPD). This is a long-term lung condition that makes it hard to breathe. It also gets worse over time. High blood pressure (hypertension), heart disease, stroke, heart attack, and lung infections, such as pneumonia. Cataracts. This is when the lenses in the eyes become clouded. Digestive problems. This may include peptic ulcers, heartburn, and gastroesophageal reflux disease (GERD). Oral health problems, such as gum disease, mouth sores, and tooth loss. Loss of taste and smell. Smoking also affects how you look and smell. Smoking may cause: Wrinkles. Yellow or stained  teeth, fingers, and fingernails. Bad breath. Bad-smelling clothes and hair. Smoking tobacco can also affect your social life, because: It may be challenging to find places to smoke when away from home. Many workplaces, [employer], hotels, and public places are tobacco-free. Smoking is  expensive. This is due to the cost of tobacco and the long-term costs of treating health problems from smoking. Secondhand smoke may affect those around you. Secondhand smoke can cause lung cancer, breathing problems, and heart disease. Children of smokers have a higher risk for: Sudden infant death syndrome (SIDS). Ear infections. Lung infections. What actions can I take to prevent health problems? Quit smoking  Do not start smoking. Quit if you already smoke. Do not replace cigarette smoking with vaping devices, such as e-cigarettes. Make a plan to quit smoking and commit to it. Look for programs to help you, and ask your health care provider for recommendations and ideas. Set a date and write down all the reasons you want to quit. Let your friends and family know you are quitting so they can help and support you. Consider finding friends who also want to quit. It can be easier to quit with someone else, so that you can support each other. Talk with your health care provider about using nicotine replacement medicines to help you quit. These include gum, lozenges, patches, sprays, or pills. If you try to quit but return to smoking, stay positive. It is common to slip up when you first quit, so take it one day at a time. Be prepared for cravings. When you feel the urge to smoke, chew gum or suck on hard candy. Lifestyle Stay busy. Take care of your body. Get plenty of exercise, eat a healthy diet, and drink plenty of water. Find ways to manage your stress, such as meditation, yoga, exercise, or time spent with friends and family. Ask your health care provider about having regular tests (screenings) to check for cancer. This may include blood tests, imaging tests, and other tests. Where to find support To get support to quit smoking, consider: Asking your health care provider for more information and resources. Joining a support group for people who want to quit smoking in your local community.  There are many effective programs that may help you to quit. Calling the smokefree.gov counselor helpline at 1-800-QUIT-NOW (410) 863-3451). Where to find more information You may find more information about quitting smoking from: Centers for Disease Control and Prevention: http://www.osborne.com/ BankR[employer]: smokefree.gov American Lung Association: freedomfromsmoking.org Contact a health care provider if: You have problems breathing. Your lips, nose, or fingers turn blue. You have chest pain. You are coughing up blood. You feel like you will faint. You have other health changes that cause you to worry. Summary Smoking tobacco can negatively affect your health, the health of those around you, your finances, and your social life. Do not start smoking. Quit if you already smoke. If you need help quitting, ask your health care provider. Consider joining a support group for people in your local community who want to quit smoking. There are many effective programs that may help you to quit. This information is not intended to replace advice given to you by your health care provider. Make sure you discuss any questions you have with your health care provider. Document Revised: 03/28/2021 Document Reviewed: 03/28/2021 Elsevier Patient Education  2023 Elsevier Inc.      Important Information About Sugar          Elson Clan, PA-C  11/28/2021 10:36  AM    [facility] [place], [place], [place]  [address] Phone: [phone]; Fax: [phone]

## 2021-11-28 ENCOUNTER — Encounter: Payer: Self-pay | Admitting: Physician Assistant

## 2021-11-28 ENCOUNTER — Ambulatory Visit: Payer: Managed Care, Other (non HMO) | Admitting: Physician Assistant

## 2021-11-28 VITALS — BP 140/90 | HR 70 | Ht 68.5 in | Wt 157.4 lb

## 2021-11-28 DIAGNOSIS — Z720 Tobacco use: Secondary | ICD-10-CM

## 2021-11-28 DIAGNOSIS — I10 Essential (primary) hypertension: Secondary | ICD-10-CM

## 2021-11-28 DIAGNOSIS — G4733 Obstructive sleep apnea (adult) (pediatric): Secondary | ICD-10-CM | POA: Diagnosis not present

## 2021-11-28 DIAGNOSIS — I6523 Occlusion and stenosis of bilateral carotid arteries: Secondary | ICD-10-CM

## 2021-11-28 DIAGNOSIS — E7849 Other hyperlipidemia: Secondary | ICD-10-CM

## 2021-11-28 DIAGNOSIS — I480 Paroxysmal atrial fibrillation: Secondary | ICD-10-CM

## 2021-11-28 MED ORDER — LOSARTAN POTASSIUM 25 MG PO TABS: 25.0000 mg | ORAL_TABLET | Freq: Every day | ORAL | 3 refills | Status: AC

## 2021-11-28 NOTE — Patient Instructions (Addendum)
Medication Instructions:  Your physician has recommended you make the following change in your medication:  START: LOSARTAN 25 MG DAILY   *If you need a refill on your cardiac medications before your next appointment, please call your pharmacy*   Lab Work: TO BE DONE IN 2 WEEKS: BMET If you have labs (blood work) drawn today and your tests are completely normal, you will receive your results only by: MyChart Message (if you have MyChart) OR A paper copy in the mail If you have any lab test that is abnormal or we need to change your treatment, we will call you to review the results.   Testing/Procedures: NONE   Follow-Up: At [facility], you and your health needs are our priority.  As part of our continuing mission to provide you with exceptional heart care, we have created designated Provider Care Teams.  These Care Teams include your primary Cardiologist (physician) and Advanced Practice Providers (APPs -  Physician Assistants and Nurse Practitioners) who all work together to provide you with the care you need, when you need it.  We recommend signing up for the patient portal called "MyChart".  Sign up information is provided on this After Visit Summary.  MyChart is used to connect with patients for Virtual Visits (Telemedicine).  Patients are able to view lab/test results, encounter notes, upcoming appointments, etc.  Non-urgent messages can be sent to your provider as well.   To learn more about what you can do with MyChart, go to [email or web address].    Your next appointment:   NEXT AVAILABLE   The format for your next appointment:   In Person  Provider:   DR. Tresa Endo   Your next appointment:   1 YEAR  The format for your next appointment:   In Person  Provider:   DR. Elease Hashimoto  OTHER:  YOUR PROVIDER RECOMMENDS THAT EXERCISE 150 MINUTES PER WEEK.    Two Gram Sodium Diet 2000 mg  What is Sodium? Sodium is a mineral found naturally in many foods. The most  significant source of sodium in the diet is table salt, which is about 40% sodium.  Processed, convenience, and preserved foods also contain a large amount of sodium.  The body needs only 500 mg of sodium daily to function,  A normal diet provides more than enough sodium even if you do not use salt.  Why Limit Sodium? A build up of sodium in the body can cause thirst, increased blood pressure, shortness of breath, and water retention.  Decreasing sodium in the diet can reduce edema and risk of heart attack or stroke associated with high blood pressure.  Keep in mind that there are many other factors involved in these health problems.  Heredity, obesity, lack of exercise, cigarette smoking, stress and what you eat all play a role.  General Guidelines: Do not add salt at the table or in cooking.  One teaspoon of salt contains over 2 grams of sodium. Read food labels Avoid processed and convenience foods Ask your dietitian before eating any foods not dicussed in the menu planning guidelines Consult your physician if you wish to use a salt substitute or a sodium containing medication such as antacids.  Limit milk and milk products to 16 oz (2 cups) per day.  Shopping Hints: READ LABELS!! "Dietetic" does not necessarily mean low sodium. Salt and other sodium ingredients are often added to foods during processing.    Menu Planning Guidelines Food Group Choose More Often Avoid  Beverages (see also  the milk group All fruit juices, low-sodium, salt-free vegetables juices, low-sodium carbonated beverages Regular vegetable or tomato juices, commercially softened water used for drinking or cooking  Breads and Cereals Enriched white, wheat, rye and pumpernickel bread, hard rolls and dinner rolls; muffins, cornbread and waffles; most dry cereals, cooked cereal without added salt; unsalted crackers and breadsticks; low sodium or homemade bread crumbs Bread, rolls and crackers with salted tops; quick breads;  instant hot cereals; pancakes; commercial bread stuffing; self-rising flower and biscuit mixes; regular bread crumbs or cracker crumbs  Desserts and Sweets Desserts and sweets mad with mild should be within allowance Instant pudding mixes and cake mixes  Fats Butter or margarine; vegetable oils; unsalted salad dressings, regular salad dressings limited to 1 Tbs; light, sour and heavy cream Regular salad dressings containing bacon fat, bacon bits, and salt pork; snack dips made with instant soup mixes or processed cheese; salted nuts  Fruits Most fresh, frozen and canned fruits Fruits processed with salt or sodium-containing ingredient (some dried fruits are processed with sodium sulfites        Vegetables Fresh, frozen vegetables and low- sodium canned vegetables Regular canned vegetables, sauerkraut, pickled vegetables, and others prepared in brine; frozen vegetables in sauces; vegetables seasoned with ham, bacon or salt pork  Condiments, Sauces, Miscellaneous  Salt substitute with physician's approval; pepper, herbs, spices; vinegar, lemon or lime juice; hot pepper sauce; garlic powder, onion powder, low sodium soy sauce (1 Tbs.); low sodium condiments (ketchup, chili sauce, mustard) in limited amounts (1 tsp.) fresh ground horseradish; unsalted tortilla chips, pretzels, potato chips, popcorn, salsa (1/4 cup) Any seasoning made with salt including garlic salt, celery salt, onion salt, and seasoned salt; sea salt, rock salt, kosher salt; meat tenderizers; monosodium glutamate; mustard, regular soy sauce, barbecue, sauce, chili sauce, teriyaki sauce, steak sauce, Worcestershire sauce, and most flavored vinegars; canned gravy and mixes; regular condiments; salted snack foods, olives, picles, relish, horseradish sauce, catsup   Food preparation: Try these seasonings Meats:    Pork Sage, onion Serve with applesauce  Chicken Poultry seasoning, thyme, parsley Serve with cranberry sauce  Lamb Curry  powder, rosemary, garlic, thyme Serve with mint sauce or jelly  Veal Marjoram, basil Serve with current jelly, cranberry sauce  Beef Pepper, bay leaf Serve with dry mustard, unsalted chive butter  Fish [place] leaf, dill Serve with unsalted lemon butter, unsalted parsley butter  Vegetables:    Asparagus Lemon juice   Broccoli Lemon juice   Carrots Mustard dressing parsley, mint, nutmeg, glazed with unsalted butter and sugar   Green beans Marjoram, lemon juice, nutmeg,dill seed   Tomatoes Basil, marjoram, onion   Spice /blend for [employer]" 4 tsp ground thyme 1 tsp ground sage 3 tsp ground rosemary 4 tsp ground marjoram   Test your knowledge A product that says "Salt Free" may still contain sodium. True or False Garlic Powder and Hot Pepper Sauce an be used as alternative seasonings.True or False Processed foods have more sodium than fresh foods.  True or False Canned Vegetables have less sodium than froze True or False   WAYS TO DECREASE YOUR SODIUM INTAKE Avoid the use of added salt in cooking and at the table.  Table salt (and other prepared seasonings which contain salt) is probably one of the greatest sources of sodium in the diet.  Unsalted foods can gain flavor from the sweet, sour, and butter taste sensations of herbs and spices.  Instead of using salt for seasoning, try the following seasonings with  the foods listed.  Remember: how you use them to enhance natural food flavors is limited only by your creativity... Allspice-Meat, fish, eggs, fruit, peas, red and yellow vegetables Almond Extract-Fruit baked goods Anise Seed-Sweet breads, fruit, carrots, beets, cottage cheese, cookies (tastes like licorice) Basil-Meat, fish, eggs, vegetables, rice, vegetables salads, soups, sauces Bay Leaf-Meat, fish, stews, poultry Burnet-Salad, vegetables (cucumber-like flavor) Caraway Seed-Bread, cookies, cottage cheese, meat, vegetables, cheese, rice Cardamon-Baked goods, fruit, soups Celery  Powder or seed-Salads, salad dressings, sauces, meatloaf, soup, bread.Do not use  celery salt Chervil-Meats, salads, fish, eggs, vegetables, cottage cheese (parsley-like flavor) Chili Power-Meatloaf, chicken cheese, corn, eggplant, egg dishes Chives-Salads cottage cheese, egg dishes, soups, vegetables, sauces Cilantro-Salsa, casseroles Cinnamon-Baked goods, fruit, pork, lamb, chicken, carrots Cloves-Fruit, baked goods, fish, pot roast, green beans, beets, carrots Coriander-Pastry, cookies, meat, salads, cheese (lemon-orange flavor) Cumin-Meatloaf, fish,cheese, eggs, cabbage,fruit pie (caraway flavor) [employer] Powder-Meat, fruit, eggs, fish, poultry, cottage cheese, vegetables Dill Seed-Meat, cottage cheese, poultry, vegetables, fish, salads, bread Fennel Seed-Bread, cookies, apples, pork, eggs, fish, beets, cabbage, cheese, Licorice-like flavor Garlic-(buds or powder) Salads, meat, poultry, fish, bread, butter, vegetables, potatoes.Do not  use garlic salt Ginger-Fruit, vegetables, baked goods, meat, fish, poultry Horseradish Root-Meet, vegetables, butter Lemon Juice or Extract-Vegetables, fruit, tea, baked goods, fish salads Mace-Baked goods fruit, vegetables, fish, poultry (taste like nutmeg) Maple Extract-Syrups Marjoram-Meat, chicken, fish, vegetables, breads, green salads (taste like Sage) Mint-Tea, lamb, sherbet, vegetables, desserts, carrots, cabbage Mustard, Dry or Seed-Cheese, eggs, meats, vegetables, poultry Nutmeg-Baked goods, fruit, chicken, eggs, vegetables, desserts Onion Powder-Meat, fish, poultry, vegetables, cheese, eggs, bread, rice salads (Do not use   Onion salt) Orange Extract-Desserts, baked goods Oregano-Pasta, eggs, cheese, onions, pork, lamb, fish, chicken, vegetables, green salads Paprika-Meat, fish, poultry, eggs, cheese, vegetables Parsley Flakes-Butter, vegetables, meat fish, poultry, eggs, bread, salads (certain forms may   Contain sodium Pepper-Meat fish,  poultry, vegetables, eggs Peppermint Extract-Desserts, baked goods Poppy Seed-Eggs, bread, cheese, fruit dressings, baked goods, noodles, vegetables, cottage  [employer] RxCheese Poultry Seasoning-Poultry,veal Rosemary-Lamb, poultry, meat, fish, cauliflower, turnips,eggs bread Saffron-Rice, bread, veal, chicken, fish, eggs Sage-Meat, fish, poultry, onions, eggplant, tomateos, pork, stews Savory-Eggs, salads, poultry, meat, rice, vegetables, soups, pork Tarragon-Meat, poultry, fish, eggs, butter, vegetables (licorice-like flavor)  Thyme-Meat, poultry, fish, eggs, vegetables, (clover-like flavor), sauces, soups Tumeric-Salads, butter, eggs, fish, rice, vegetables (saffron-like flavor) Vanilla Extract-Baked goods, candy Vinegar-Salads, vegetables, meat marinades Walnut Extract-baked goods, candy   2. Choose your Foods Wisely   The following is a list of foods to avoid which are high in sodium:  Meats-Avoid all smoked, canned, salt cured, dried and kosher meat and fish as well as Anchovies   Lox Freescale SemiconductorBacon    Luncheon meats:Bologna, Liverwurst, Pastrami Canned meat or fish  Marinated herring Caviar    Pepperoni Corned Beef   Pizza Dried chipped beef  Salami Frozen breaded fish or meat Salt pork Frankfurters or hot dogs  Sardines Gefilte fish   Sausage Ham (boiled ham, Proscuitto Smoked butt    spiced ham)   Spam      TV Dinners Vegetables Canned vegetables (Regular) Relish Canned mushrooms  Sauerkraut Olives    Tomato juice Pickles  Bakery and Dessert Products Canned puddings  Cream pies Cheesecake   Decorated cakes Cookies  Beverages/Juices Tomato juice, regular  Gatorade   V-8 vegetable juice, regular  Breads and Cereals Biscuit mixes   Salted potato chips, corn chips, pretzels Bread stuffing mixes  Salted crackers and rolls Pancake and waffle mixes Self-rising flour  Seasonings Accent    Meat sauces Barbecue  sauce  Meat tenderizer Catsup    Monosodium glutamate (MSG) Celery  salt   Onion salt Chili sauce   Prepared mustard Garlic salt   Salt, seasoned salt, sea salt Gravy mixes   Soy sauce Horseradish   Steak sauce Ketchup   Tartar sauce Lite salt    Teriyaki sauce Marinade mixes   Worcestershire sauce  Others Baking powder   Cocoa and cocoa mixes Baking soda   Commercial casserole mixes Candy-caramels, chocolate  Dehydrated soups    Bars, fudge,nougats  Instant rice and pasta mixes Canned broth or soup  Maraschino cherries Cheese, aged and processed cheese and cheese spreads  Learning Assessment Quiz  Indicated T (for True) or F (for False) for each of the following statements:  _____ Fresh fruits and vegetables and unprocessed grains are generally low in sodium _____ Water may contain a considerable amount of sodium, depending on the source _____ You can always tell if a food is high in sodium by tasting it _____ Certain laxatives my be high in sodium and should be avoided unless prescribed   by a physician or pharmacist _____ Salt substitutes may be used freely by anyone on a sodium restricted diet _____ Sodium is present in table salt, food additives and as a natural component of   most foods _____ Table salt is approximately 90% sodium _____ Limiting sodium intake may help prevent excess fluid accumulation in the body _____ On a sodium-restricted diet, seasonings such as bouillon soy sauce, and    cooking wine should be used in place of table salt _____ On an ingredient list, a product which lists monosodium glutamate as the first   ingredient is an appropriate food to include on a low sodium diet  Circle the best answer(s) to the following statements (Hint: there may be more than one correct answer)  11. On a low-sodium diet, some acceptable snack items are:    A. Olives  F. Bean dip   K. Grapefruit juice    B. Salted Pretzels G. Commercial Popcorn   L. Canned peaches    C. Carrot Sticks  H. Bouillon   M. Unsalted nuts   D. [place]  fries  I. Peanut butter crackers N. Salami   E. Sweet pickles J. Tomato Juice   O. Pizza  12.  Seasonings that may be used freely on a reduced - sodium diet include   A. Lemon wedges F.Monosodium glutamate K. Celery seed    B.Soysauce   G. Pepper   L. Mustard powder   C. Sea salt  H. Cooking wine  M. Onion flakes   D. Vinegar  E. Prepared horseradish N. Salsa   E. Sage   J. Worcestershire sauce  O. Chutney  Smoking Tobacco Information, Adult Smoking tobacco can be harmful to your health. Tobacco contains a toxic colorless chemical called nicotine. Nicotine causes changes in your brain that make you want more and more. This is called addiction. This can make it hard to stop smoking once you start. Tobacco also has other toxic chemicals that can hurt your body and raise your risk of many cancers. Menthol or "lite" tobacco or cigarette brands are not safer than regular brands. How can smoking tobacco affect me? Smoking tobacco puts you at risk for: Cancer. Smoking is most commonly associated with lung cancer, but can also lead to cancer in other parts of the body. Chronic obstructive pulmonary disease (COPD). This is a long-term lung condition that makes it hard to breathe. It  also gets worse over time. High blood pressure (hypertension), heart disease, stroke, heart attack, and lung infections, such as pneumonia. Cataracts. This is when the lenses in the eyes become clouded. Digestive problems. This may include peptic ulcers, heartburn, and gastroesophageal reflux disease (GERD). Oral health problems, such as gum disease, mouth sores, and tooth loss. Loss of taste and smell. Smoking also affects how you look and smell. Smoking may cause: Wrinkles. Yellow or stained teeth, fingers, and fingernails. Bad breath. Bad-smelling clothes and hair. Smoking tobacco can also affect your social life, because: It may be challenging to find places to smoke when away from home. Many workplaces,  [employer], hotels, and public places are tobacco-free. Smoking is expensive. This is due to the cost of tobacco and the long-term costs of treating health problems from smoking. Secondhand smoke may affect those around you. Secondhand smoke can cause lung cancer, breathing problems, and heart disease. Children of smokers have a higher risk for: Sudden infant death syndrome (SIDS). Ear infections. Lung infections. What actions can I take to prevent health problems? Quit smoking  Do not start smoking. Quit if you already smoke. Do not replace cigarette smoking with vaping devices, such as e-cigarettes. Make a plan to quit smoking and commit to it. Look for programs to help you, and ask your health care provider for recommendations and ideas. Set a date and write down all the reasons you want to quit. Let your friends and family know you are quitting so they can help and support you. Consider finding friends who also want to quit. It can be easier to quit with someone else, so that you can support each other. Talk with your health care provider about using nicotine replacement medicines to help you quit. These include gum, lozenges, patches, sprays, or pills. If you try to quit but return to smoking, stay positive. It is common to slip up when you first quit, so take it one day at a time. Be prepared for cravings. When you feel the urge to smoke, chew gum or suck on hard candy. Lifestyle Stay busy. Take care of your body. Get plenty of exercise, eat a healthy diet, and drink plenty of water. Find ways to manage your stress, such as meditation, yoga, exercise, or time spent with friends and family. Ask your health care provider about having regular tests (screenings) to check for cancer. This may include blood tests, imaging tests, and other tests. Where to find support To get support to quit smoking, consider: Asking your health care provider for more information and resources. Joining a  support group for people who want to quit smoking in your local community. There are many effective programs that may help you to quit. Calling the smokefree.gov counselor helpline at 1-800-QUIT-NOW 8074205788). Where to find more information You may find more information about quitting smoking from: Centers for Disease Control and Prevention: http://www.osborne.com/ BankRights.uy: smokefree.gov American Lung Association: freedomfromsmoking.org Contact a health care provider if: You have problems breathing. Your lips, nose, or fingers turn blue. You have chest pain. You are coughing up blood. You feel like you will faint. You have other health changes that cause you to worry. Summary Smoking tobacco can negatively affect your health, the health of those around you, your finances, and your social life. Do not start smoking. Quit if you already smoke. If you need help quitting, ask your health care provider. Consider joining a support group for people in your local community who want to quit smoking. There are  many effective programs that may help you to quit. This information is not intended to replace advice given to you by your health care provider. Make sure you discuss any questions you have with your health care provider. Document Revised: 03/28/2021 Document Reviewed: 03/28/2021 Elsevier Patient Education  2023 Elsevier Inc.      Important Information About Sugar

## 2021-12-04 ENCOUNTER — Telehealth: Payer: Self-pay | Admitting: *Deleted

## 2021-12-04 NOTE — Telephone Encounter (Signed)
Received  progress note from Sarah Dr Namon Cirri nurse that says the patient states he has OSA and he is doing good but is not sleeping good. Patient feels like his mask is leaking and he does not sleep well. He uses [facility] for OSA but would like to switch to [facility].

## 2021-12-12 ENCOUNTER — Ambulatory Visit: Payer: BC Managed Care – PPO | Attending: Physician Assistant

## 2021-12-12 DIAGNOSIS — I6523 Occlusion and stenosis of bilateral carotid arteries: Secondary | ICD-10-CM | POA: Diagnosis not present

## 2021-12-12 DIAGNOSIS — E7849 Other hyperlipidemia: Secondary | ICD-10-CM

## 2021-12-12 DIAGNOSIS — I480 Paroxysmal atrial fibrillation: Secondary | ICD-10-CM

## 2021-12-12 DIAGNOSIS — I10 Essential (primary) hypertension: Secondary | ICD-10-CM | POA: Diagnosis not present

## 2021-12-12 LAB — BASIC METABOLIC PANEL
BUN/Creatinine Ratio: 14 (ref 9–20)
BUN: 14 mg/dL (ref 6–24)
CO2: 25 mmol/L (ref 20–29)
Calcium: 9.1 mg/dL (ref 8.7–10.2)
Chloride: 104 mmol/L (ref 96–106)
Creatinine, Ser: 1.01 mg/dL (ref 0.76–1.27)
Glucose: 100 mg/dL — ABNORMAL HIGH (ref 70–99)
Potassium: 3.9 mmol/L (ref 3.5–5.2)
Sodium: 144 mmol/L (ref 134–144)
eGFR: 86 mL/min/{1.73_m2} (ref >59)

## 2022-01-17 DIAGNOSIS — M25522 Pain in left elbow: Secondary | ICD-10-CM | POA: Diagnosis not present

## 2022-01-17 DIAGNOSIS — R21 Rash and other nonspecific skin eruption: Secondary | ICD-10-CM | POA: Diagnosis not present

## 2022-03-16 DIAGNOSIS — Z419 Encounter for procedure for purposes other than remedying health state, unspecified: Secondary | ICD-10-CM | POA: Diagnosis not present

## 2022-04-02 IMAGING — DX DG CHEST 1V PORT
1 series · 2 of 2 positions shown · non-contrast
Comparison: None.

CLINICAL DATA: Tachycardia and shortness of breath.

EXAM:
PORTABLE CHEST 1 VIEW

[Series 1: chest · 0.14mm/px · 2 of 2 slices shown]
[im 1/2]
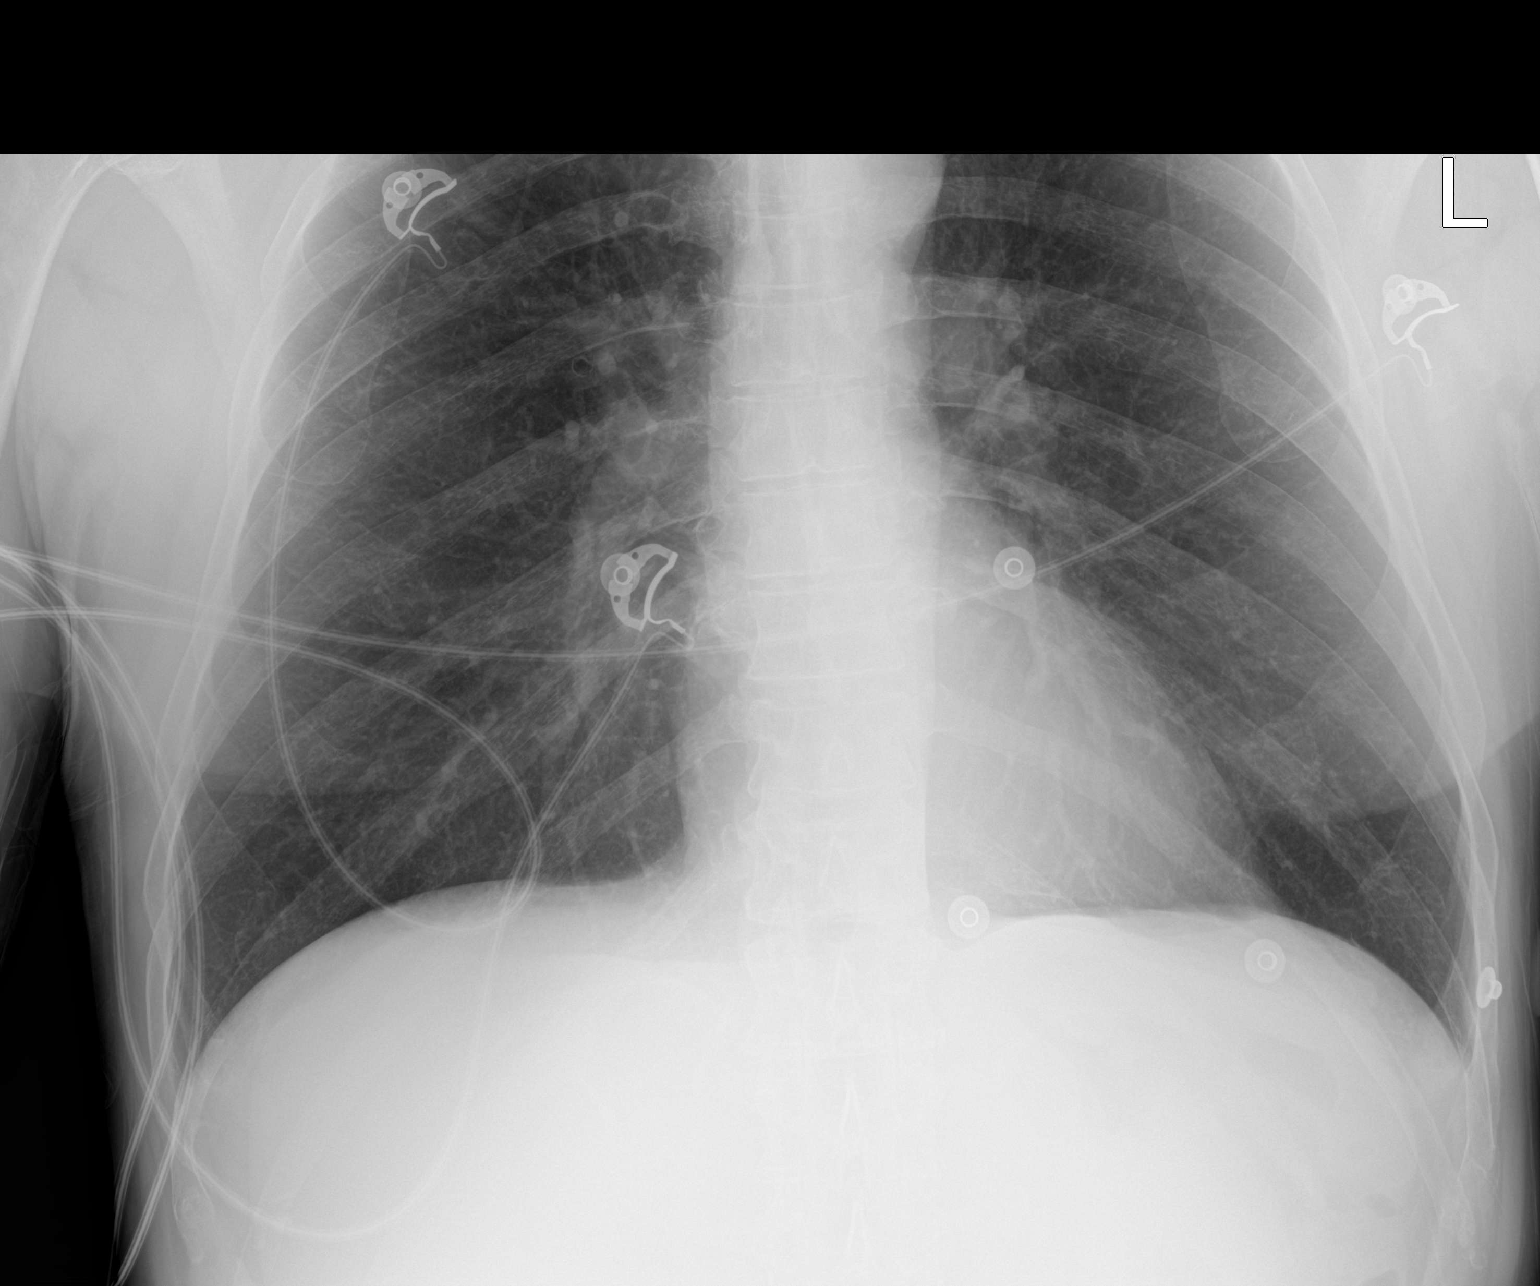
[im 2/2]
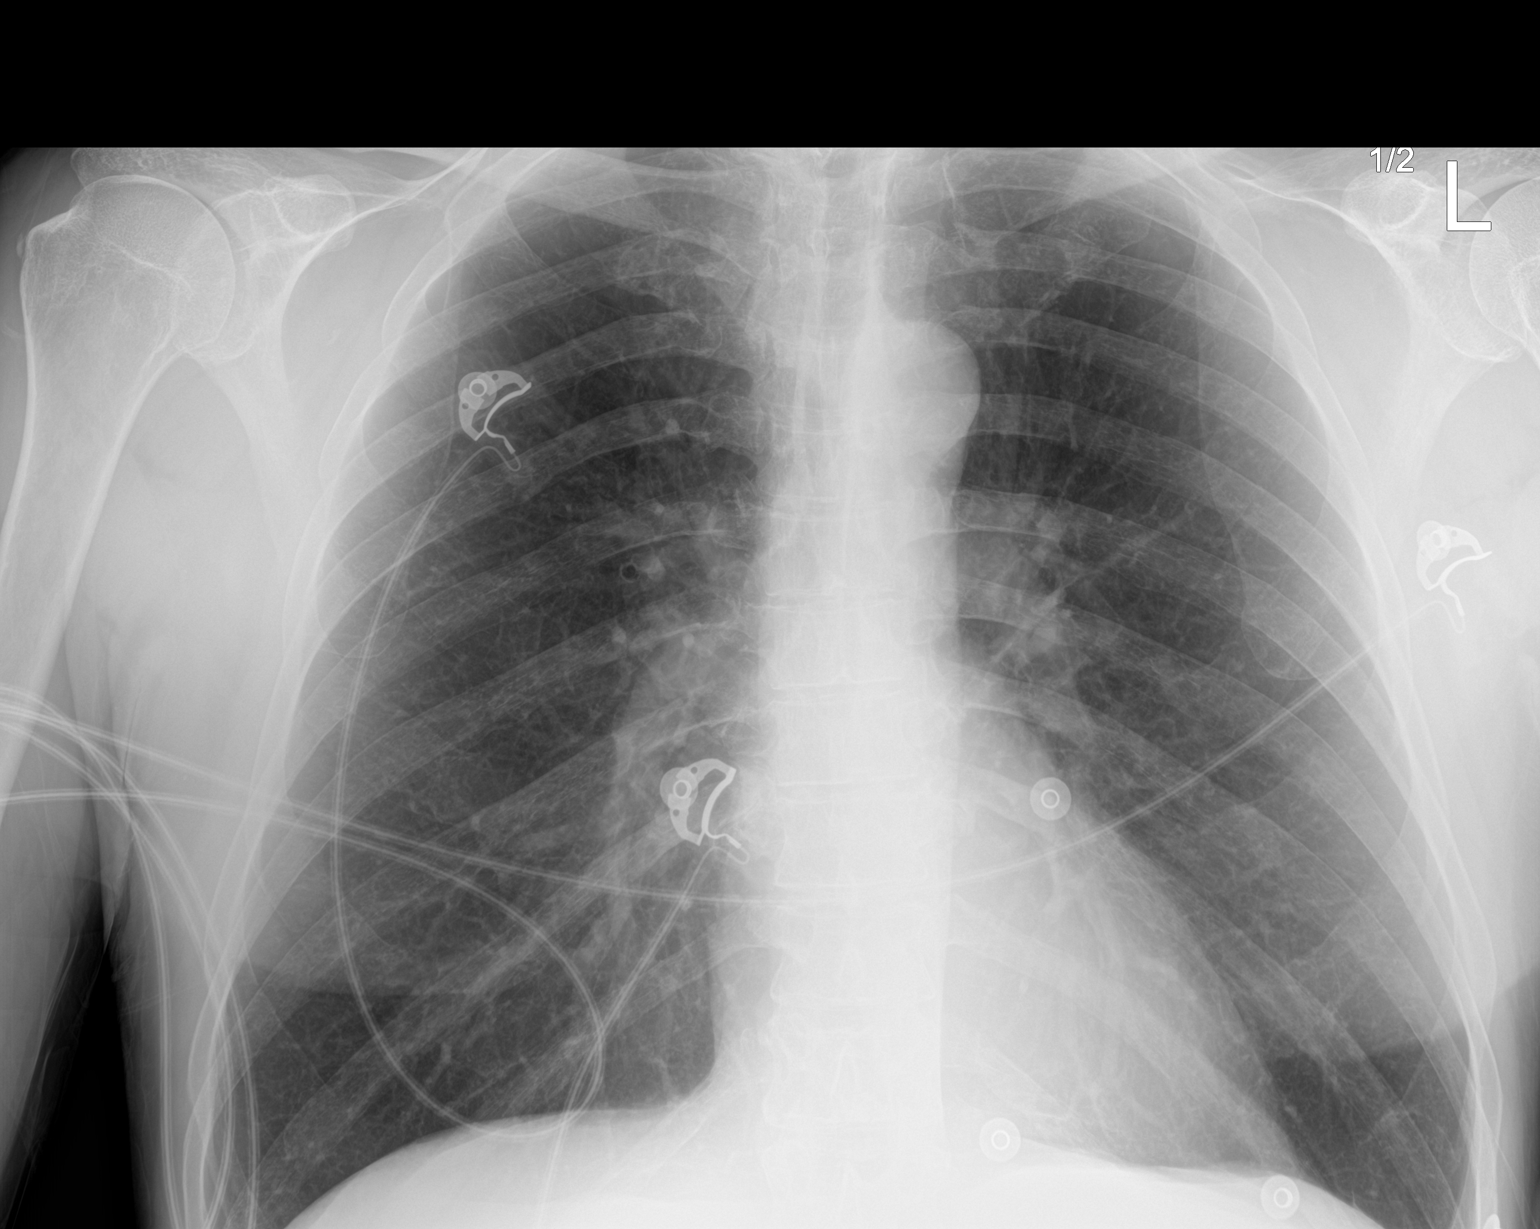

[2 of 2 positions shown; findings below may reference images not displayed]

FINDINGS: The lungs are clear without focal pneumonia, edema, pneumothorax or
pleural effusion. The cardiopericardial silhouette is within normal
limits for size. The visualized bony structures of the thorax show
no acute abnormality. Telemetry leads overlie the chest.
IMPRESSION: No active disease.

## 2022-05-24 ENCOUNTER — Encounter (HOSPITAL_COMMUNITY): Payer: Self-pay | Admitting: *Deleted

## 2022-06-19 ENCOUNTER — Other Ambulatory Visit: Payer: Self-pay | Admitting: Cardiovascular Disease

## 2022-06-30 ENCOUNTER — Other Ambulatory Visit (HOSPITAL_COMMUNITY): Payer: Self-pay | Admitting: Nurse Practitioner

## 2022-07-07 ENCOUNTER — Other Ambulatory Visit (HOSPITAL_COMMUNITY): Payer: Self-pay | Admitting: Cardiovascular Disease

## 2022-07-26 ENCOUNTER — Ambulatory Visit: Payer: BC Managed Care – PPO | Admitting: Dermatology

## 2022-08-13 ENCOUNTER — Other Ambulatory Visit: Payer: Self-pay | Admitting: *Deleted

## 2022-08-13 MED ORDER — DILTIAZEM HCL 30 MG PO TABS: ORAL_TABLET | ORAL | 3 refills | Status: AC

## 2022-09-06 ENCOUNTER — Ambulatory Visit (INDEPENDENT_AMBULATORY_CARE_PROVIDER_SITE_OTHER): Payer: No Typology Code available for payment source | Admitting: Dermatology

## 2022-09-06 ENCOUNTER — Encounter: Payer: Self-pay | Admitting: Dermatology

## 2022-09-06 VITALS — BP 91/56

## 2022-09-06 DIAGNOSIS — W57XXXA Bitten or stung by nonvenomous insect and other nonvenomous arthropods, initial encounter: Secondary | ICD-10-CM

## 2022-09-06 DIAGNOSIS — L570 Actinic keratosis: Secondary | ICD-10-CM

## 2022-09-06 MED ORDER — TRIAMCINOLONE ACETONIDE 0.1 % EX OINT: 1.0000 | TOPICAL_OINTMENT | Freq: Two times a day (BID) | CUTANEOUS | 0 refills | Status: AC

## 2022-09-06 NOTE — Progress Notes (Signed)
   New Patient Visit   Subjective  Anthony Shields is a 62 y.o. male who presents for the following: He is concerned of red marks on his arms x 2-3 months. They are itchy at times. It can be as itchy as 8 on itch scale. It is currently a 0 on itch scale. 2 months ago he was prescribed an unknown cream which did not help. He does feel rough scaliness on the arms. No personal history of skin cancer. He has never had a full skin exam.  The following portions of the chart were reviewed this encounter and updated as appropriate: medications, allergies, medical history  Review of Systems:  No other skin or systemic complaints except as noted in HPI or Assessment and Plan.  Objective  Well appearing patient in no apparent distress; mood and affect are within normal limits.   A focused examination was performed of the following areas: Arms, face  Relevant exam findings are noted in the Assessment and Plan.  bilateral forearms, left dorsal hand, (17) Erythematous thin papules/macules with gritty scale.     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  Jaclynn Guarneri, CMA, am acting as scribe for [employer], DO.   Documentation:  I have reviewed the above documentation for accuracy and completeness, and I agree with the above.  Langston Reusing, DO

## 2022-09-06 NOTE — Patient Instructions (Addendum)
Cryotherapy Aftercare  Wash gently with soap and water everyday.   Apply Vaseline and Band-Aid daily until healed.   Due to recent changes in healthcare laws, you may see results of your pathology and/or laboratory studies on MyChart before the doctors have had a chance to review them. We understand that in some cases there may be results that are confusing or concerning to you. Please understand that not all results are received at the same time and often the doctors may need to interpret multiple results in order to provide you with the best plan of care or course of treatment. Therefore, we ask that you please give us 2 business days to thoroughly review all your results before contacting the office for clarification. Should we see a critical lab result, you will be contacted sooner.   If You Need Anything After Your Visit  If you have any questions or concerns for your doctor, please call our main line at [phone] If no one answers, please leave a voicemail as directed and we will return your call as soon as possible. Messages left after 4 pm will be answered the following business day.   You may also send us a message via MyChart. We typically respond to MyChart messages within 1-2 business days.  For prescription refills, please ask your pharmacy to contact our office. Our fax number is [phone].  If you have an urgent issue when the clinic is closed that cannot wait until the next business day, you can page your doctor at the number below.    Please note that while we do our best to be available for urgent issues outside of office hours, we are not available 24/7.   If you have an urgent issue and are unable to reach us, you may choose to seek medical care at your doctor's office, retail clinic, urgent care center, or emergency room.  If you have a medical emergency, please immediately call 911 or go to the emergency department. In the event of inclement weather, please call our  main line at [phone] for an update on the status of any delays or closures.  Dermatology Medication Tips: Please keep the boxes that topical medications come in in order to help keep track of the instructions about where and how to use these. Pharmacies typically print the medication instructions only on the boxes and not directly on the medication tubes.   If your medication is too expensive, please contact our office at [phone] or send us a message through MyChart.   We are unable to tell what your co-pay for medications will be in advance as this is different depending on your insurance coverage. However, we may be able to find a substitute medication at lower cost or fill out paperwork to get insurance to cover a needed medication.   If a prior authorization is required to get your medication covered by your insurance company, please allow us 1-2 business days to complete this process.  Drug prices often vary depending on where the prescription is filled and some pharmacies may offer cheaper prices.  The website www.goodrx.com contains coupons for medications through different pharmacies. The prices here do not account for what the cost may be with help from insurance (it may be cheaper with your insurance), but the website can give you the price if you did not use any insurance.  - You can print the associated coupon and take it with your prescription to the pharmacy.  - You may also   stop by our office during regular business hours and pick up a GoodRx coupon card.  - If you need your prescription sent electronically to a different pharmacy, notify our office through [place] MyChart or by phone at [phone]     

## 2022-11-06 ENCOUNTER — Ambulatory Visit: Payer: 59 | Admitting: Cardiovascular Disease

## 2023-01-25 ENCOUNTER — Ambulatory Visit: Payer: No Typology Code available for payment source | Admitting: Cardiovascular Disease

## 2023-03-11 ENCOUNTER — Ambulatory Visit: Payer: 59 | Admitting: Dermatology

## 2023-04-22 NOTE — Progress Notes (Deleted)
 Cardiology Office Note:    Date:  04/22/2023   ID:  Anthony Shields, DOB 1960/10/22, MRN [id number]  PCP:  Marvene Prentice SAUNDERS, FNP   [facility] Providers Cardiologist:  Reyes Fifield    Referring MD: Marvene Prentice SAUNDERS, FNP   No chief complaint on file.   Aug. 23, 2022   Anthony Shields is a 63 y.o. male with a hx of atrial fib. Hx of OSA . He has been seen in the atrial fibrillation clinic.  He was referred to general cardiology for follow-up visit and to establish with general cardiology.  Has PAF , OSA  His original presentation was to the emergency room.  He received IV diltiazem  and converted back to sinus rhythm. Smokes 1/2 ppd  5-6 beers a day - is cutting back  Walks some , no real cardio exercise   Feb. 27, 2023  Anthony Shields is seen for follow up of his Atrial fib, OSA Smokes 1/2 ppd  BP has been elevated.  BP is up and down.   Has cut back on salty foods  Has OSA,  still does not have a CPAP machine  - sees [facility] for OSA  Took a Diltiazem  30 mg tablet today   Jan. 7, 2025 Anthony Shields os seem for follow up of his PAF , HTN     No past medical history on file.  No past surgical history on file.  Current Medications: No outpatient medications have been marked as taking for the 04/23/23 encounter (Appointment) with Anthony Shields, Anthony PARAS, MD.     Allergies:   Patient has no known allergies.   Social History   Socioeconomic History   Marital status: Married    Spouse name: Not on file   Number of children: Not on file   Years of education: Not on file   Highest education level: Not on file  Occupational History   Not on file  Tobacco Use   Smoking status: Every Day    Current packs/day: 0.50    Average packs/day: 0.5 packs/day for 35.0 years (17.5 ttl pk-yrs)    Types: Cigarettes   Smokeless tobacco: Never   Tobacco comments:    Half pack daily  Substance and Sexual Activity   Alcohol use: Yes    Alcohol/week: 22.0 standard drinks of alcohol    Types: 21 Cans of  beer, 1 Shots of liquor per week   Drug use: Yes    Frequency: 3.0 times per week    Types: Marijuana   Sexual activity: Not on file  Other Topics Concern   Not on file  Social History Narrative   Not on file   Social Drivers of Health   Financial Resource Strain: Not on file  Food Insecurity: Not on file  Transportation Needs: Not on file  Physical Activity: Not on file  Stress: Not on file  Social Connections: Unknown (08/29/2021)   Received [facility]   Social Network    Social Network: Not on file     Family History: The patient's family history is not on file.  ROS:   Please see the history of present illness.     All other systems reviewed and are negative.  EKGs/Labs/Other Studies Reviewed:    The following studies were reviewed today:    EKG:        Recent Labs: No results found for requested labs within last 365 days.  Recent Lipid Panel No results found for: CHOL, TRIG, HDL, CHOLHDL,  VLDL, LDLCALC, LDLDIRECT   Risk Assessment/Calculations:    CHA2DS2-VASc Score =    This indicates a  % annual risk of stroke. The patient's score is based upon:             Physical Exam:    Physical Exam: There were no vitals taken for this visit.  No BP recorded.  {Refresh Note OR Click here to enter BP  :1}***    GEN:  Well nourished, well developed in no acute distress HEENT: Normal NECK: No JVD; No carotid bruits LYMPHATICS: No lymphadenopathy CARDIAC: RRR ***, no murmurs, rubs, gallops RESPIRATORY:  Clear to auscultation without rales, wheezing or rhonchi  ABDOMEN: Soft, non-tender, non-distended MUSCULOSKELETAL:  No edema; No deformity  SKIN: Warm and dry NEUROLOGIC:  Alert and oriented x 3    ASSESSMENT:    No diagnosis found.  PLAN:       Paroxysmal atrial fibrillation:   n    2.  HTN:   BP has been elevated.   Still smoking.   Still has uncontrolled OSA - waiting on CPAP machine.   Supply chain  issues   ***    Medication Adjustments/Labs and Tests Ordered: Current medicines are reviewed at length with the patient today.  Concerns regarding medicines are outlined above.  No orders of the defined types were placed in this encounter.   No orders of the defined types were placed in this encounter.     There are no Patient Instructions on file for this visit.   Signed, Anthony Passe, MD  04/22/2023 7:09 AM    [facility]

## 2023-04-23 ENCOUNTER — Ambulatory Visit: Payer: Self-pay | Admitting: Cardiovascular Disease
